# Patient Record
Sex: Female | Born: 1935 | Marital: Married | Smoking: Never smoker
Health system: Southern US, Community
[De-identification: ages and names within clinical notes are randomized; demographics above are authoritative.]

## PROBLEM LIST (undated history)

## (undated) DIAGNOSIS — I639 Cerebral infarction, unspecified: Secondary | ICD-10-CM

## (undated) DIAGNOSIS — C801 Malignant (primary) neoplasm, unspecified: Secondary | ICD-10-CM

## (undated) DIAGNOSIS — I671 Cerebral aneurysm, nonruptured: Secondary | ICD-10-CM

## (undated) DIAGNOSIS — G629 Polyneuropathy, unspecified: Secondary | ICD-10-CM

## (undated) DIAGNOSIS — F32A Depression, unspecified: Secondary | ICD-10-CM

## (undated) DIAGNOSIS — I4891 Unspecified atrial fibrillation: Secondary | ICD-10-CM

## (undated) DIAGNOSIS — F329 Major depressive disorder, single episode, unspecified: Secondary | ICD-10-CM

## (undated) DIAGNOSIS — F419 Anxiety disorder, unspecified: Secondary | ICD-10-CM

## (undated) DIAGNOSIS — E785 Hyperlipidemia, unspecified: Secondary | ICD-10-CM

## (undated) DIAGNOSIS — M5030 Other cervical disc degeneration, unspecified cervical region: Secondary | ICD-10-CM

## (undated) HISTORY — PX: COLON RESECTION: SHX5231

---

## 2011-11-19 ENCOUNTER — Ambulatory Visit: Payer: Self-pay | Admitting: Hematology and Oncology

## 2011-12-07 ENCOUNTER — Ambulatory Visit: Payer: Self-pay | Admitting: Hematology and Oncology

## 2012-09-24 ENCOUNTER — Emergency Department: Payer: Self-pay | Admitting: Emergency Medicine

## 2012-09-24 LAB — URINALYSIS, COMPLETE
Bilirubin,UR: NEGATIVE
RBC,UR: 32 /HPF (ref 0–5)
Specific Gravity: 1.013 (ref 1.003–1.030)
WBC UR: 337 /HPF (ref 0–5)

## 2012-09-24 LAB — COMPREHENSIVE METABOLIC PANEL
Albumin: 3.2 g/dL — ABNORMAL LOW (ref 3.4–5.0)
Alkaline Phosphatase: 74 U/L (ref 50–136)
BUN: 27 mg/dL — ABNORMAL HIGH (ref 7–18)
Bilirubin,Total: 0.9 mg/dL (ref 0.2–1.0)
Creatinine: 1.44 mg/dL — ABNORMAL HIGH (ref 0.60–1.30)
EGFR (African American): 37 — ABNORMAL LOW
EGFR (Non-African Amer.): 32 — ABNORMAL LOW
Glucose: 138 mg/dL — ABNORMAL HIGH (ref 65–99)
Potassium: 4 mmol/L (ref 3.5–5.1)
SGOT(AST): 18 U/L (ref 15–37)
SGPT (ALT): 14 U/L (ref 12–78)
Total Protein: 6.6 g/dL (ref 6.4–8.2)

## 2012-09-24 LAB — CBC WITH DIFFERENTIAL/PLATELET
Eosinophil %: 0.1 %
HCT: 37.1 % (ref 35.0–47.0)
HGB: 12.4 g/dL (ref 12.0–16.0)
Lymphocyte #: 0.4 10*3/uL — ABNORMAL LOW (ref 1.0–3.6)
Lymphocyte %: 3.3 %
MCHC: 33.4 g/dL (ref 32.0–36.0)
MCV: 99 fL (ref 80–100)
Monocyte %: 6.7 %
Neutrophil #: 9.9 10*3/uL — ABNORMAL HIGH (ref 1.4–6.5)
Neutrophil %: 89.5 %
RDW: 13.4 % (ref 11.5–14.5)

## 2013-05-27 ENCOUNTER — Observation Stay: Payer: Self-pay | Admitting: Family Medicine

## 2013-05-27 LAB — COMPREHENSIVE METABOLIC PANEL
Albumin: 3 g/dL — ABNORMAL LOW (ref 3.4–5.0)
Alkaline Phosphatase: 61 U/L
Anion Gap: 3 — ABNORMAL LOW (ref 7–16)
BILIRUBIN TOTAL: 0.4 mg/dL (ref 0.2–1.0)
BUN: 21 mg/dL — ABNORMAL HIGH (ref 7–18)
CALCIUM: 8.7 mg/dL (ref 8.5–10.1)
CREATININE: 1.45 mg/dL — AB (ref 0.60–1.30)
Chloride: 98 mmol/L (ref 98–107)
Co2: 36 mmol/L — ABNORMAL HIGH (ref 21–32)
EGFR (African American): 36 — ABNORMAL LOW
GFR CALC NON AF AMER: 31 — AB
Glucose: 92 mg/dL (ref 65–99)
OSMOLALITY: 276 (ref 275–301)
Potassium: 4 mmol/L (ref 3.5–5.1)
SGOT(AST): 21 U/L (ref 15–37)
SGPT (ALT): 11 U/L — ABNORMAL LOW (ref 12–78)
Sodium: 137 mmol/L (ref 136–145)
Total Protein: 6.4 g/dL (ref 6.4–8.2)

## 2013-05-27 LAB — PROTIME-INR
INR: 1.9
Prothrombin Time: 21.5 secs — ABNORMAL HIGH (ref 11.5–14.7)

## 2013-05-27 LAB — PRO B NATRIURETIC PEPTIDE: B-Type Natriuretic Peptide: 2482 pg/mL — ABNORMAL HIGH (ref 0–450)

## 2013-05-27 LAB — CBC WITH DIFFERENTIAL/PLATELET
Basophil #: 0 10*3/uL (ref 0.0–0.1)
Basophil %: 0.8 %
Eosinophil #: 0.1 10*3/uL (ref 0.0–0.7)
Eosinophil %: 3.1 %
HCT: 37.2 % (ref 35.0–47.0)
HGB: 12.1 g/dL (ref 12.0–16.0)
Lymphocyte #: 1.2 10*3/uL (ref 1.0–3.6)
Lymphocyte %: 25 %
MCH: 32.4 pg (ref 26.0–34.0)
MCHC: 32.6 g/dL (ref 32.0–36.0)
MCV: 100 fL (ref 80–100)
MONO ABS: 0.3 x10 3/mm (ref 0.2–0.9)
Monocyte %: 7 %
NEUTROS ABS: 3 10*3/uL (ref 1.4–6.5)
NEUTROS PCT: 64.1 %
Platelet: 147 10*3/uL — ABNORMAL LOW (ref 150–440)
RBC: 3.74 10*6/uL — ABNORMAL LOW (ref 3.80–5.20)
RDW: 13.8 % (ref 11.5–14.5)
WBC: 4.7 10*3/uL (ref 3.6–11.0)

## 2013-05-27 LAB — URINALYSIS, COMPLETE
BILIRUBIN, UR: NEGATIVE
Glucose,UR: NEGATIVE mg/dL (ref 0–75)
KETONE: NEGATIVE
Nitrite: NEGATIVE
PROTEIN: NEGATIVE
Ph: 7 (ref 4.5–8.0)
RBC,UR: 2 /HPF (ref 0–5)
Specific Gravity: 1.006 (ref 1.003–1.030)
Transitional Epi: <1
WBC UR: 158 /HPF (ref 0–5)

## 2013-05-27 LAB — CK TOTAL AND CKMB (NOT AT ARMC)
CK, TOTAL: 31 U/L
CK, Total: 34 U/L
CK, Total: 29 U/L
CK-MB: 1 ng/mL (ref 0.5–3.6)
CK-MB: 0.9 ng/mL (ref 0.5–3.6)
CK-MB: 1 ng/mL (ref 0.5–3.6)

## 2013-05-27 LAB — TROPONIN I
Troponin-I: <0.02 ng/mL
Troponin-I: <0.02 ng/mL
Troponin-I: <0.02 ng/mL

## 2013-05-28 DIAGNOSIS — R079 Chest pain, unspecified: Secondary | ICD-10-CM

## 2013-05-28 DIAGNOSIS — I10 Essential (primary) hypertension: Secondary | ICD-10-CM

## 2013-05-28 LAB — PROTIME-INR
INR: 2.2
Prothrombin Time: 24.1 secs — ABNORMAL HIGH (ref 11.5–14.7)

## 2013-05-28 LAB — BASIC METABOLIC PANEL
ANION GAP: 1 — AB (ref 7–16)
BUN: 19 mg/dL — ABNORMAL HIGH (ref 7–18)
CALCIUM: 8.9 mg/dL (ref 8.5–10.1)
CO2: 36 mmol/L — AB (ref 21–32)
Chloride: 103 mmol/L (ref 98–107)
Creatinine: 1.32 mg/dL — ABNORMAL HIGH (ref 0.60–1.30)
EGFR (African American): 41 — ABNORMAL LOW
EGFR (Non-African Amer.): 35 — ABNORMAL LOW
Glucose: 90 mg/dL (ref 65–99)
Osmolality: 281 (ref 275–301)
Potassium: 4.2 mmol/L (ref 3.5–5.1)
Sodium: 140 mmol/L (ref 136–145)

## 2013-05-28 LAB — CBC WITH DIFFERENTIAL/PLATELET
BASOS ABS: 0 10*3/uL (ref 0.0–0.1)
Basophil %: 1.1 %
EOS ABS: 0.2 10*3/uL (ref 0.0–0.7)
EOS PCT: 4 %
HCT: 37.4 % (ref 35.0–47.0)
HGB: 12.4 g/dL (ref 12.0–16.0)
LYMPHS ABS: 1 10*3/uL (ref 1.0–3.6)
LYMPHS PCT: 24.7 %
MCH: 33 pg (ref 26.0–34.0)
MCHC: 33.1 g/dL (ref 32.0–36.0)
MCV: 100 fL (ref 80–100)
MONO ABS: 0.4 x10 3/mm (ref 0.2–0.9)
Monocyte %: 9.2 %
Neutrophil #: 2.4 10*3/uL (ref 1.4–6.5)
Neutrophil %: 61 %
Platelet: 163 10*3/uL (ref 150–440)
RBC: 3.75 10*6/uL — ABNORMAL LOW (ref 3.80–5.20)
RDW: 13.6 % (ref 11.5–14.5)
WBC: 3.9 10*3/uL (ref 3.6–11.0)

## 2013-05-28 LAB — LIPID PANEL
Cholesterol: 201 mg/dL — ABNORMAL HIGH (ref 0–200)
HDL: 43 mg/dL (ref 40–60)
LDL CHOLESTEROL, CALC: 137 mg/dL — AB (ref 0–100)
TRIGLYCERIDES: 106 mg/dL (ref 0–200)
VLDL Cholesterol, Calc: 21 mg/dL (ref 5–40)

## 2013-05-28 LAB — TSH: Thyroid Stimulating Horm: 2.51 u[IU]/mL

## 2013-06-02 LAB — URINE CULTURE

## 2013-07-11 ENCOUNTER — Emergency Department: Payer: Self-pay | Admitting: Emergency Medicine

## 2013-07-12 LAB — CBC WITH DIFFERENTIAL/PLATELET
BASOS ABS: 0 10*3/uL (ref 0.0–0.1)
BASOS PCT: 0.2 %
EOS ABS: 0.1 10*3/uL (ref 0.0–0.7)
Eosinophil %: 0.5 %
HCT: 41.9 % (ref 35.0–47.0)
HGB: 13.4 g/dL (ref 12.0–16.0)
LYMPHS ABS: 0.3 10*3/uL — AB (ref 1.0–3.6)
LYMPHS PCT: 2.8 %
MCH: 32.1 pg (ref 26.0–34.0)
MCHC: 32.1 g/dL (ref 32.0–36.0)
MCV: 100 fL (ref 80–100)
Monocyte #: 0.1 x10 3/mm — ABNORMAL LOW (ref 0.2–0.9)
Monocyte %: 0.8 %
Neutrophil #: 10.5 10*3/uL — ABNORMAL HIGH (ref 1.4–6.5)
Neutrophil %: 95.7 %
Platelet: 211 10*3/uL (ref 150–440)
RBC: 4.19 10*6/uL (ref 3.80–5.20)
RDW: 14.1 % (ref 11.5–14.5)
WBC: 11 10*3/uL (ref 3.6–11.0)

## 2013-07-12 LAB — COMPREHENSIVE METABOLIC PANEL
ALBUMIN: 3.6 g/dL (ref 3.4–5.0)
ALK PHOS: 108 U/L
AST: 26 U/L (ref 15–37)
Anion Gap: 8 (ref 7–16)
BILIRUBIN TOTAL: 0.9 mg/dL (ref 0.2–1.0)
BUN: 19 mg/dL — AB (ref 7–18)
CREATININE: 1.48 mg/dL — AB (ref 0.60–1.30)
Calcium, Total: 9 mg/dL (ref 8.5–10.1)
Chloride: 99 mmol/L (ref 98–107)
Co2: 29 mmol/L (ref 21–32)
GFR CALC AF AMER: 36 — AB
GFR CALC NON AF AMER: 31 — AB
Glucose: 137 mg/dL — ABNORMAL HIGH (ref 65–99)
OSMOLALITY: 276 (ref 275–301)
Potassium: 4 mmol/L (ref 3.5–5.1)
SGPT (ALT): 17 U/L (ref 12–78)
Sodium: 136 mmol/L (ref 136–145)
Total Protein: 7.6 g/dL (ref 6.4–8.2)

## 2013-07-12 LAB — PROTIME-INR
INR: 2.3
Prothrombin Time: 24.7 secs — ABNORMAL HIGH (ref 11.5–14.7)

## 2014-04-29 NOTE — H&P (Signed)
PATIENT NAME:  Martha Mayer, Martha Mayer MR#:  [id number] DATE OF BIRTH:  Jun 24, 1919  DATE OF ADMISSION:  05/27/2013  PRIMARY CARE PHYSICIAN: Delon Sacramento, MD  REFERRING PHYSICIAN: Dr. Owens Shark  CHIEF COMPLAINT: Chest pain.  HISTORY OF PRESENT ILLNESS: The patient is a 81 year old pleasant Caucasian female with past medical history of chronic atrial fibrillation on Coumadin, hypertension, history of stroke, and chronic lower extremity edema who is presenting to the ER with the chief complaint of chest pain. The patient is reporting that yesterday evening at around 9:00 the patient was having midsternal chest pain associated with shortness of breath and nausea. Denies any dizziness or loss of consciousness. Eventually she has called EMS and by the time she came to the ER the chest pain is completely resolved. During my examination, the patient is chest pain-free. The patient is having right eye patch and reporting that she has clots in her right eye blood vessels and they could not evacuate them. The patient has chronic atrial fibrillation and takes Coumadin 6 mg once daily. Denies any heart attacks in the past, but she has chronic atrial fibrillation. She has chronic lower extremity edema and takes a fluid pill, Lasix, regarding that, but denies any history of congestive heart failure. Denies any chest pain or shortness of breath during my examination. The patient does not think she was seen by any cardiologist in the past and she never had any stress test done.   PAST MEDICAL HISTORY: Hypertension, stroke, chronic lower extremity edema, chronic atrial fibrillation and on Coumadin, colon cancer, arthritis, bladder cancer.   PAST SURGICAL HISTORY: Partial colectomy, cystoscopy.  ALLERGIES: METHADONE AND STATIN.   HOME MEDICATIONS: Warfarin 6 mg once daily, vitamin B12 1000 mcg p.o. once daily, Tylenol 325 mg 2 tablets p.o. 3 times a day, TUMS 1 tablet p.o. 3 times a day, Sanctura extended release 60 mg 1  capsule p.o. once daily, allopurinol 50 mg p.o. once daily, MiraLax 17 grams p.o. once daily, metoprolol tartrate 25 mg p.o. b.i.d., Lexapro 10 mg once daily, Lamictal 25 mg 3 tablets 2 times a day, Gabapentin 300 mg 1 capsule p.o. once a day, furosemide 40 mg p.o. once daily, fish oil 1 tablet p.o. 2 times a day, aspirin 81 mg once daily, alprazolam 0.25 mg 1/2 tablet p.o. once daily.   FAMILY HISTORY: Hypertension runs in her family.   PSYCHOSOCIAL HISTORY: Lives at home with her son. No smoking, alcohol or illicit drug usage.  REVIEW OF SYSTEMS: CONSTITUTIONAL: Denies any fever, fatigue, weakness.  EYES: Denies blurry vision, but complaining of right eye pain, recent history of clots in her right eye which were not evacuated, currently wearing eyepatch with decreased vision.  ENT: Denies epistaxis, discharge, hearing loss or tinnitus.  RESPIRATORY: Denies cough, COPD. CARDIOVASCULAR: Chest pain is resolved. Denies any palpitations, syncope.  GASTROINTESTINAL: Denies nausea, vomiting, diarrhea, abdominal pain, hematemesis.  GENITOURINARY: No dysuria or hematuria. GYN AND BREASTS: Denies breast mass or vaginal discharge.  ENDOCRINE: Denies polyuria, nocturia, thyroid problems.  HEMATOLOGIC AND LYMPHATIC: No anemia, easy bruising, bleeding. INTEGUMENTARY: No acne, rash, lesions.  MUSCULOSKELETAL: No joint pain in the neck and back. Denies gout.  NEUROLOGIC: Denies vertigo, ataxia. Denies any weakness.  PSYCHIATRIC: No ADD, OCD.   PHYSICAL EXAMINATION: VITAL SIGNS: Temperature 98.7, pulse 67, respirations 18, blood pressure 113/76, pulse ox 95%.  GENERAL APPEARANCE: Not in acute distress. Moderately built and nourished.  HEENT: Normocephalic, atraumatic. Left pupil is equally reacting to light and accommodation. Right eye is partially  shut and wearing right eye patch because of recent history of clots in her ophthalmic arteries, as reported by the patient. Nares are patent. No congestion.  Moist mucous membranes.  NECK: Supple. No JVD. No thyromegaly. Range of motion is intact.  LUNGS: Clear to auscultation bilaterally. No accessory muscle usage. No anterior chest wall tenderness on palpation.  CARDIOVASCULAR: Irregularly irregular. Positive murmur. No gallop.  ABDOMEN: Soft. Bowel sounds are positive in all 4 quadrants. Nontender, nondistended. No hepatosplenomegaly. No masses felt.  NEUROLOGIC: Awake, alert, and oriented x3. Motor and sensory grossly intact. Reflexes are 2+.  EXTREMITIES: 2+ pitting edema is present. No cyanosis. No clubbing.  SKIN: Warm to touch. Decreased turgor from age. No cyanosis.  PSYCH: Normal mood and affect.  DIAGNOSTIC DATA: PT 21.5. INR 1.9. Urinalysis: Yellow in color, hazy in appearance, nitrite negative, leukocyte esterase 3+, WBC clumps are present.  WBC 4.7, hemoglobin 12.1, hematocrit 37.2, platelets 147,000. CK total 34, CPK-MB 1. Troponin less than 0.02. LFTs are normal, except albumin at 3 and ALT at 11. Chem-8: Glucose 92. BNP is 2482. BUN 21, creatinine 1.45, sodium 137. Potassium normal. Chloride normal. Anion gap is 3. Serum osmolality and calcium are normal. GFR 31.  Portable chest x-ray has revealed COPD, stable cardiomegaly without pulmonary edema or acute cardiac abnormalities.   A 12-lead EKG has revealed atrial fibrillation at 66 beats per minute. No acute ST-T wave changes.   ASSESSMENT AND PLAN: A 81 year old Caucasian female presenting to the ER with the chief complaint of chest pain and shortness of breath, approximately lasted for 1 hour, which was resolved by the time she arrived to the ER. Will be admitted with the following assessment and plan: 1.  Chest pain. Rule out acute coronary syndrome. The patient will be admitted to telemetry bed. ACS protocol with oxygen, nitroglycerin, aspirin, beta blocker and statin. Will obtain 2-D echocardiogram. Cardiology consult is placed to Dr. Humphrey Rolls as the patient is reporting that she was  not seen by any cardiologist in the past.  2.  Chronic atrial fibrillation. INR is subtherapeutic at 1.9. Continue Coumadin and follow PT/INR.  3.  Chronic lower extremity edema. Continue Lasix and potassium supplements as needed basis.  4.  Hypertension. Continue metoprolol and up titrate medications as needed basis.  5.  Chronic history of cerebrovascular accident. Continue aspirin and statin.  6.  Acute cystitis. Will obtain urine culture and provide her IV Rocephin.  7.  Recent history of right eye with blood clots, status post eye patch. The patient is recommended to follow up with ophthalmology as an outpatient.  8.  We will provide gastrointestinal and deep vein thrombosis prophylaxis.   She is FULL code. Son is the medical power of attorney.  TIME SPENT: 50 minutes.   ____________________________ Nicholes Mango, MD ag:sb D: 05/27/2013 08:02:20 ET T: 05/27/2013 09:21:11 ET JOB#: [id number]  cc: Nicholes Mango, MD, <Dictator> [facility] [place], [place] Nicholes Mango MD ELECTRONICALLY SIGNED 06/09/2013 7:54

## 2014-04-29 NOTE — Consult Note (Signed)
PATIENT NAME:  Martha Mayer, Martha Mayer MR#:  [id number] DATE OF BIRTH:  02-25-1919  DATE OF CONSULTATION:  05/27/2013  REFERRING PHYSICIAN:   CONSULTING PHYSICIAN:  Dionisio David, MD  INDICATION FOR CONSULTATION: Chest pain.   HISTORY OF PRESENT ILLNESS: This is a 81 year old white female with a past medical history of chronic atrial fibrillation on Coumadin, history of CVA and hypertension, who presented to the Emergency Room with chest pain. The chest pain started yesterday around 9:00 a.m. and was brought by EMS to the hospital. In the hospital, she was found to be in atrial fibrillation and she had swelling of the legs. It appeared that she was in CHF, thus I was asked to evaluate the patient.   PAST MEDICAL HISTORY: History of hypertension, stroke, lower extremity edema, history of atrial fibrillation on Coumadin, colon cancer, arthritis and bladder cancer.   ALLERGIES: METHADONE AND STATIN.   MEDICATIONS: Coumadin 6 mg, MiraLax, metoprolol, Lexapro and7 Lasix.   FAMILY HISTORY: Positive for hypertension.  PSYCHOSOCIAL HISTORY: Unremarkable.   PHYSICAL EXAMINATION:  GENERAL: She is alert and oriented x 3, in no acute distress. VITAL SIGNS: Blood pressure is 113/76, respirations 18, pulse 67, temperature 98.7 and saturation is 90%.  NECK: Positive JVD.  LUNGS: There are few crackles at the bases.  HEART: Irregularly irregular. Normal S1, S2. No audible murmur.  ABDOMEN: Soft, nontender, positive bowel sounds.  EXTREMITIES: 1+ pedal edema.   EKG shows atrial fibrillation, 66 beats per minute, nonspecific ST-T changes. Creatinine is 1.45. BNP is 2482. CPK is 34. MB fraction 1. Troponin is less than 0.02.   ASSESSMENT AND PLAN: The patient has atypical chest pain with findings suggestive of congestive heart failure. I agree with getting an echocardiogram. I also agree with putting the patient on aspirin and Lasix 40 mg once a day. Advised changing it to intravenous and will look at  echocardiogram to make further recommendations. The patient appears to be no longer having any chest pain. We have to see whether the patient has diastolic or systolic heart failure based on echocardiogram. Thus, we will make further recommendation after looking at echocardiogram. Right now with medical therapy, she has stabilized and it appears that her troponins are negative thus myocardial infarction has been pretty much ruled out.   Will follow closely with you. Thank you very much for the referral.  ____________________________ Dionisio David, MD sak:aw D: 05/27/2013 10:41:25 ET T: 05/27/2013 10:50:32 ET JOB#: [id number]  cc: Dionisio David, MD, <Dictator> Dionisio David MD ELECTRONICALLY SIGNED 06/01/2013 13:59

## 2014-04-29 NOTE — Discharge Summary (Signed)
PATIENT NAME:  Martha Mayer, Martha Mayer MR#:  [id number] DATE OF BIRTH:  1919/05/02  DATE OF ADMISSION:  05/27/2013 DATE OF DISCHARGE:  05/28/2013  REASON FOR ADMISSION: Initially chest pain.  DISCHARGE DIAGNOSES:  1.  Chest pain, atypical. 2.  Diastolic heart failure, compensated. 3.  Chronic atrial fibrillation. 4.  Chronic anticoagulation. 5.  Chronic lower extremity edema. 6.  Hypertension. 7.  History of previous cerebrovascular accident. 8.  Urinary tract infection was ruled out, as cultures were negative. 9.  History of recurrent urinary tract infections, on prophylaxis. 10.  History of right eye blood clots, status post eye patch. 11.  Chronic kidney disease. 12.  Hypercholesterolemia with LDL of 137. Her coronary artery disease risk score over 10 years is only 12%, for which no statin is going to be prescribed since she is already 41 based on possibility of creating more side-effects.   DISPOSITION: Home.  MEDICATIONS AT DISCHARGE: 1.  Aspirin 81 mg daily. 2.  Furosemide 40 mg daily. 3.  TUMS 500 mg 3 times daily. 4.  Vitamin B12, 1000 mcg once a day. 5.  Tylenol 325 mg 2 tablets twice daily as needed for pain. 6.  Simethicone 80 mg tablet chewable 3 times a day. 7.  Enulose as needed for constipation. 8.  Myrbetriq 25 mg extended-release once a day. 9.  Sanctura XR 60 mg once a day in the morning. 10.  Milk of Magnesia as needed for constipation. 11.  MiraLax as needed for constipation. 12.  Warfarin 6 mg once a day. 13.  Lexapro 10 mg once daily.  14.  Metamucil 1 scoop once a day. 15.  Alprazolam 0.25 mg take 1/2 tablet once a day at bedtime. 16.  Keflex 250 mg at bedtime for urinary tract infection prevention. 17.  Gabapentin 300 mg once a day. 18.  Xalatan to affected eye at bedtime. 19.  Ranitidine 150 mg at bedtime. 20.  Fish oil 1 tab 2 times daily. 21.  Lamictal 25 mg take 2 tablets twice daily. 22.  Metoprolol 25 mg twice daily.  DIET: Low-sodium, regular  consistency.  ACTIVITY: As tolerated.  FOLLOWUP: With Dr. Neoma Laming in the next 1 to 2 weeks. With PA [facility] in the next 2 to 4 weeks.   HOSPITAL COURSE: This is a very nice 81 year old female who was admitted with a history of chest pain. The patient was admitted on 05/27/2013 in the Emergency Department. She came up with chief complaint of pain starting at 9 p.m. the night of evaluation, midsternal, associated with shortness of breath and nausea. The patient also had mild dizziness but no other problems. She was found to have recently right eye blood clots, for which she wears a patch, and she has been taking Coumadin. The patient has lower extremity edema, which is chronic, and did not know that she had congestive heart failure or not on admission. Whenever the patient was evaluated, cardiac enzymes were negative. EKG did not show any significant changes of the ST with any depression or elevation. It just showed atrial fibrillation with a heart rate of 66 per minute. The patient was admitted for evaluation of chest pain. We consulted cardiology. Cardiology thought that this was more related to her CHF and decided to diurese her with IV Lasix. The patient had good diuresis, and her pain was resolved pretty much since she arrived to the emergency department. The patient states that this has never happened before, but she feels confident that she is not  having a heart attack. We recommended followup with Dr. Humphrey Rolls. At this moment, she has an echocardiogram that shows significant changes with no valvulopathy, significant hypertensive cardiomyopathy with ejection fraction of 60% to 65%, severely dilated left atrium, mildly dilated right atrium, mild aortic valve stenosis, severely increased left ventricular posterior wall thickness. All of this puts her in a category of diastolic dysfunction. The patient had a urinalysis that showed a possibility of UTI with white blood cells of 158, bacteria  +3, leukocyte esterase +3, but I believe this is just in general chronic pyuria, as her urine culture was negative. We stopped the antibiotics and sent her home back on her regular dose of Keflex.   The patient did not require any significant oxygen. Her chest x-ray did not show any pulmonary edema, just changes that could be recurrent for COPD. On my visual examination, I actually see some fluid cephalization, but not significant to call it a CHF exacerbation. I think this is more a compensated problem.  The patient is discharged in good condition.   As for other problems: 1.  Hypertension: The patient is just to continue her home medications with metoprolol and also her Lasix treatment.  2.  As far as her recurrent UTIs, continue Keflex. 3.  As far as her chronic atrial fibrillation, her INR was supratherapeutic at 1.9. Continue Coumadin at 6 mg for now.  4.  She had a CVA in the past, for which she needs to continue aspirin. Statin was started, but stopped as the patient has more risk of having side-effects from these medications at her age, and her cardiovascular risk is only 12% in the next 10 years. Continue to monitor and treatment of blood pressure. Follow up with Dr. Humphrey Rolls for further evaluation as necessary.  The patient did well during this hospitalization.   I spent about 40 minutes discharging this patient. Case discussed with family.    ____________________________ Milford Sink, MD rsg:jcm D: 05/28/2013 09:36:52 ET T: 05/28/2013 19:58:47 ET JOB#: [id number]  cc: Faulk Sink, MD, <Dictator> Dionisio David, MD [place], [place] Shalin Vonbargen America Brown MD ELECTRONICALLY SIGNED 06/11/2013 10:50

## 2014-12-02 ENCOUNTER — Encounter: Payer: Self-pay | Admitting: Medical Oncology

## 2014-12-02 ENCOUNTER — Emergency Department: Payer: Medicare Other

## 2014-12-02 ENCOUNTER — Emergency Department
Admission: EM | Admit: 2014-12-02 | Discharge: 2014-12-02 | Disposition: A | Discharge status: Home / Self Care | Payer: Medicare Other | Attending: Emergency Medicine | Admitting: Emergency Medicine

## 2014-12-02 DIAGNOSIS — Z79899 Other long term (current) drug therapy: Secondary | ICD-10-CM | POA: Insufficient documentation

## 2014-12-02 DIAGNOSIS — M79641 Pain in right hand: Secondary | ICD-10-CM

## 2014-12-02 DIAGNOSIS — Z792 Long term (current) use of antibiotics: Secondary | ICD-10-CM | POA: Diagnosis not present

## 2014-12-02 DIAGNOSIS — Z7901 Long term (current) use of anticoagulants: Secondary | ICD-10-CM | POA: Insufficient documentation

## 2014-12-02 DIAGNOSIS — M19041 Primary osteoarthritis, right hand: Secondary | ICD-10-CM | POA: Diagnosis not present

## 2014-12-02 HISTORY — DX: Hyperlipidemia, unspecified: E78.5

## 2014-12-02 HISTORY — DX: Depression, unspecified: F32.A

## 2014-12-02 HISTORY — DX: Other cervical disc degeneration, unspecified cervical region: M50.30

## 2014-12-02 HISTORY — DX: Major depressive disorder, single episode, unspecified: F32.9

## 2014-12-02 HISTORY — DX: Unspecified atrial fibrillation: I48.91

## 2014-12-02 HISTORY — DX: Cerebral infarction, unspecified: I63.9

## 2014-12-02 HISTORY — DX: Polyneuropathy, unspecified: G62.9

## 2014-12-02 HISTORY — DX: Malignant (primary) neoplasm, unspecified: C80.1

## 2014-12-02 HISTORY — DX: Anxiety disorder, unspecified: F41.9

## 2014-12-02 MED ORDER — ACETAMINOPHEN-CODEINE #3 300-30 MG PO TABS: 1.0000 | ORAL_TABLET | Freq: Three times a day (TID) | ORAL | Status: DC | PRN

## 2014-12-02 MED ORDER — ACETAMINOPHEN-CODEINE #3 300-30 MG PO TABS
1.0000 | ORAL_TABLET | Freq: Once | ORAL | Status: AC
  Administered 2014-12-02: 1 via ORAL
  Filled 2014-12-02: qty 1

## 2014-12-02 NOTE — ED Provider Notes (Signed)
[facility] Emergency Department Provider Note ____________________________________________  Time seen: 0845  I have reviewed the triage vital signs and the nursing notes.  HISTORY  Chief Complaint  Hand Pain  HPI Martha Mayer is a 81 y.o. female reports to the ED by her adult daughter from [facility], for evaluation of right hand pain. The patient reports intermittent hand pain over the last several months without known injury or trauma. She describes intermittent pain, and numbness to the right hand. She has a history of osteoarthritis which is worse in the right hand. She denies any relief with intermittent Tylenol dosing at the nursing home.She denies any current pain at intake.  Past Medical History  Diagnosis Date  . Atrial fibrillation, unspecified   . Hyperlipemia   . Neuropathy ([place])   . Depression   . DDD (degenerative disc disease), cervical   . Anxiety   . Cancer ([place])   . Stroke [facility])     There are no active problems to display for this patient.   Past Surgical History  Procedure Laterality Date  . Colon resection      Current Outpatient Rx  Name  Route  Sig  Dispense  Refill  . ALPRAZolam (XANAX) 0.25 MG tablet   Oral   Take 0.25 mg by mouth at bedtime.         . cephALEXin (KEFLEX) 250 MG capsule   Oral   Take by mouth at bedtime.         . docusate sodium (COLACE) 100 MG capsule   Oral   Take 100 mg by mouth 2 (two) times daily.         Marland Kitchen escitalopram (LEXAPRO) 20 MG tablet   Oral   Take 20 mg by mouth daily.         . furosemide (LASIX) 40 MG tablet   Oral   Take 40 mg by mouth.         . gabapentin (NEURONTIN) 300 MG capsule   Oral   Take 300 mg by mouth at bedtime.         . lamoTRIgine (LAMICTAL) 100 MG tablet   Oral   Take 100 mg by mouth 2 (two) times daily.         Marland Kitchen latanoprost (XALATAN) 0.005 % ophthalmic solution      1 drop at bedtime.         . metoprolol tartrate  (LOPRESSOR) 25 MG tablet   Oral   Take 25 mg by mouth 2 (two) times daily.         . mirabegron ER (MYRBETRIQ) 50 MG TB24 tablet   Oral   Take 50 mg by mouth daily.         . Omega-3 Fatty Acids (FISH OIL) 1000 MG CAPS   Oral   Take 1,000 mg by mouth 2 (two) times daily.         Marland Kitchen oxybutynin (DITROPAN-XL) 5 MG 24 hr tablet   Oral   Take 5 mg by mouth at bedtime.         . prochlorperazine (COMPAZINE) 5 MG tablet   Oral   Take 5 mg by mouth every 6 (six) hours as needed for nausea or vomiting.         . psyllium (METAMUCIL) 58.6 % powder   Oral   Take 1 packet by mouth 3 (three) times daily.         Marland Kitchen warfarin (COUMADIN) 7.5 MG tablet  Oral   Take 7.5 mg by mouth daily.         Marland Kitchen acetaminophen-codeine (TYLENOL #3) 300-30 MG tablet   Oral   Take 1 tablet by mouth every 8 (eight) hours as needed for moderate pain.   15 tablet   0    Allergies Nystatin  No family history on file.  Social History Social History  Substance Use Topics  . Smoking status: Never Smoker   . Smokeless tobacco: None  . Alcohol Use: No   Review of Systems  Constitutional: Negative for fever. Eyes: Negative for visual changes. ENT: Negative for sore throat. Cardiovascular: Negative for chest pain. Respiratory: Negative for shortness of breath. Gastrointestinal: Negative for abdominal pain, vomiting and diarrhea. Genitourinary: Negative for dysuria. Musculoskeletal: Negative for back pain. Right hand pain as above. Skin: Negative for rash. Neurological: Negative for headaches, focal weakness or numbness. ____________________________________________  PHYSICAL EXAM:  VITAL SIGNS: ED Triage Vitals  Enc Vitals Group     BP 12/02/14 0728 165/62 mmHg     Pulse Rate 12/02/14 0728 81     Resp 12/02/14 0728 20     Temp 12/02/14 0728 98.1 F (36.7 C)     Temp Source 12/02/14 0728 Oral     SpO2 12/02/14 0728 96 %     Weight 12/02/14 0728 207 lb (93.895 kg)     Height  12/02/14 0728 5\' 3"  (1.6 m)     Head Cir --      Peak Flow --      Pain Score 12/02/14 0729 0     Pain Loc --      Pain Edu? --      Excl. in [place]? --    Constitutional: Alert and oriented. Well appearing and in no distress. Head: Normocephalic and atraumatic.      Eyes: Conjunctivae are normal. PERRL. Normal extraocular movements      Ears: Canals clear. TMs intact bilaterally.   Nose: No congestion/rhinorrhea.   Mouth/Throat: Mucous membranes are moist.   Neck: Supple. No thyromegaly. Hematological/Lymphatic/Immunological: No cervical lymphadenopathy. Cardiovascular: Normal rate, regular rhythm. Normal distal pulses. Respiratory: Normal respiratory effort. No wheezes/rales/rhonchi. Gastrointestinal: Soft and nontender. No distention. Musculoskeletal: Right hand without obvious bony changes consistent with underlying osteoarthritis. Hand is without any known noticeable swelling, ecchymosis, bruise, or abrasion. Patient was normal composite fist on exam. Nontender with normal range of motion in all extremities.  Neurologic:  Cranial nerves II through XII grossly intact. Normal intrinsic and opposition testing. Mild interosseous muscle wasting noted. Normal gait without ataxia. Normal speech and language. No gross focal neurologic deficits are appreciated. Skin:  Skin is warm, dry and intact. No rash noted. Psychiatric: Mood and affect are normal. Patient exhibits appropriate insight and judgment. ____________________________________________   RADIOLOGY Left Hand IMPRESSION: 1. Moderate to severe polyarticular osteoarthritis in the right hand as described. 2. Diffuse osteopenia.  I, Algernon Mundie, Dannielle Karvonen, personally viewed and evaluated these images (plain radiographs) as part of my medical decision making.  ____________________________________________  PROCEDURES  Tylenol #3  ____________________________________________  INITIAL IMPRESSION / ASSESSMENT AND PLAN / ED  COURSE  Patient with right hand pain and paresthesias likely due to underlying osteoarthritis. She will be discharged with prescription for Tylenol with Codeine to dose as needed. She will follow up with the house officer for ongoing pain management as needed. ____________________________________________  FINAL CLINICAL IMPRESSION(S) / ED DIAGNOSES  Final diagnoses:  Primary osteoarthritis of right hand  Hand pain, right  Dannielle Karvonen [place], PA-C 12/02/14 0932  Nance Pear, MD 12/02/14 1041

## 2014-12-02 NOTE — ED Notes (Signed)
Right hand pain for several months   Became worse over the past 2 days   Was unable to sleep last pm d/t pain  No injury

## 2014-12-02 NOTE — Discharge Instructions (Signed)
Osteoarthritis Osteoarthritis is a disease that causes soreness and inflammation of a joint. It occurs when the cartilage at the affected joint wears down. Cartilage acts as a cushion, covering the ends of bones where they meet to form a joint. Osteoarthritis is the most common form of arthritis. It often occurs in older people. The joints affected most often by this condition include those in the:  Ends of the fingers.  Thumbs.  Neck.  Lower back.  Knees.  Hips. CAUSES  Over time, the cartilage that covers the ends of bones begins to wear away. This causes bone to rub on bone, producing pain and stiffness in the affected joints.  RISK FACTORS Certain factors can increase your chances of having osteoarthritis, including:  Older age.  Excessive body weight.  Overuse of joints.  Previous joint injury. SIGNS AND SYMPTOMS   Pain, swelling, and stiffness in the joint.  Over time, the joint may lose its normal shape.  Small deposits of bone (osteophytes) may grow on the edges of the joint.  Bits of bone or cartilage can break off and float inside the joint space. This may cause more pain and damage. DIAGNOSIS  Your health care provider will do a physical exam and ask about your symptoms. Various tests may be ordered, such as:  X-rays of the affected joint.  Blood tests to rule out other types of arthritis. Additional tests may be used to diagnose your condition. TREATMENT  Goals of treatment are to control pain and improve joint function. Treatment plans may include:  A prescribed exercise program that allows for rest and joint relief.  A weight control plan.  Pain relief techniques, such as:  Properly applied heat and cold.  Electric pulses delivered to nerve endings under the skin (transcutaneous electrical nerve stimulation [TENS]).  Massage.  Certain nutritional supplements.  Medicines to control pain, such as:  Acetaminophen.  Nonsteroidal  anti-inflammatory drugs (NSAIDs), such as naproxen.  Narcotic or central-acting agents, such as tramadol.  Corticosteroids. These can be given orally or as an injection.  Surgery to reposition the bones and relieve pain (osteotomy) or to remove loose pieces of bone and cartilage. Joint replacement may be needed in advanced states of osteoarthritis. HOME CARE INSTRUCTIONS   Take medicines only as directed by your health care provider.  Maintain a healthy weight. Follow your health care provider's instructions for weight control. This may include dietary instructions.  Exercise as directed. Your health care provider can recommend specific types of exercise. These may include:  Strengthening exercises. These are done to strengthen the muscles that support joints affected by arthritis. They can be performed with weights or with exercise bands to add resistance.  Aerobic activities. These are exercises, such as brisk walking or low-impact aerobics, that get your heart pumping.  Range-of-motion activities. These keep your joints limber.  Balance and agility exercises. These help you maintain daily living skills.  Rest your affected joints as directed by your health care provider.  Keep all follow-up visits as directed by your health care provider. SEEK MEDICAL CARE IF:   Your skin turns red.  You develop a rash in addition to your joint pain.  You have worsening joint pain.  You have a fever along with joint or muscle aches. SEEK IMMEDIATE MEDICAL CARE IF:  You have a significant loss of weight or appetite.  You have night sweats. [place] of Arthritis and Musculoskeletal and Skin Diseases: www.niams.SouthExposed.es  National Institute on  Aging: http://kim-miller.com/  American College of Rheumatology: www.rheumatology.org   This information is not intended to replace advice given to you by your health care provider. Make sure you discuss any questions you  have with your health care provider.   Document Released: 12/23/2004 Document Revised: 01/13/2014 Document Reviewed: 08/30/2012 Elsevier Interactive Patient Education 2016 [place] the prescription pain medicine as needed for hand pain. Follow-up with you primary care provider for ongoing symptoms.

## 2014-12-02 NOTE — ED Notes (Signed)
PT reports that she has been having pain to rt hand since last night, pt reports that she has been having numbness to rt hand off and on for a few months but pain worsened last night.

## 2015-07-06 ENCOUNTER — Encounter: Payer: Self-pay | Admitting: Emergency Medicine

## 2015-07-06 ENCOUNTER — Emergency Department
Admission: EM | Admit: 2015-07-06 | Discharge: 2015-07-06 | Disposition: A | Discharge status: Home / Self Care | Payer: Medicare Other | Attending: Emergency Medicine | Admitting: Emergency Medicine

## 2015-07-06 ENCOUNTER — Emergency Department: Payer: Medicare Other

## 2015-07-06 DIAGNOSIS — Z8673 Personal history of transient ischemic attack (TIA), and cerebral infarction without residual deficits: Secondary | ICD-10-CM | POA: Diagnosis not present

## 2015-07-06 DIAGNOSIS — Z79899 Other long term (current) drug therapy: Secondary | ICD-10-CM | POA: Insufficient documentation

## 2015-07-06 DIAGNOSIS — W1800XA Striking against unspecified object with subsequent fall, initial encounter: Secondary | ICD-10-CM | POA: Insufficient documentation

## 2015-07-06 DIAGNOSIS — Z7901 Long term (current) use of anticoagulants: Secondary | ICD-10-CM | POA: Insufficient documentation

## 2015-07-06 DIAGNOSIS — Y929 Unspecified place or not applicable: Secondary | ICD-10-CM | POA: Insufficient documentation

## 2015-07-06 DIAGNOSIS — E785 Hyperlipidemia, unspecified: Secondary | ICD-10-CM | POA: Insufficient documentation

## 2015-07-06 DIAGNOSIS — I671 Cerebral aneurysm, nonruptured: Secondary | ICD-10-CM | POA: Insufficient documentation

## 2015-07-06 DIAGNOSIS — Y939 Activity, unspecified: Secondary | ICD-10-CM | POA: Diagnosis not present

## 2015-07-06 DIAGNOSIS — Z859 Personal history of malignant neoplasm, unspecified: Secondary | ICD-10-CM | POA: Insufficient documentation

## 2015-07-06 DIAGNOSIS — Y999 Unspecified external cause status: Secondary | ICD-10-CM | POA: Diagnosis not present

## 2015-07-06 DIAGNOSIS — I4891 Unspecified atrial fibrillation: Secondary | ICD-10-CM | POA: Insufficient documentation

## 2015-07-06 DIAGNOSIS — M5030 Other cervical disc degeneration, unspecified cervical region: Secondary | ICD-10-CM | POA: Insufficient documentation

## 2015-07-06 DIAGNOSIS — F329 Major depressive disorder, single episode, unspecified: Secondary | ICD-10-CM | POA: Insufficient documentation

## 2015-07-06 DIAGNOSIS — S0990XA Unspecified injury of head, initial encounter: Secondary | ICD-10-CM | POA: Diagnosis present

## 2015-07-06 LAB — BASIC METABOLIC PANEL
Anion gap: 5 (ref 5–15)
BUN: 24 mg/dL — AB (ref 6–20)
CHLORIDE: 103 mmol/L (ref 101–111)
CO2: 32 mmol/L (ref 22–32)
CREATININE: 1.24 mg/dL — AB (ref 0.44–1.00)
Calcium: 8.7 mg/dL — ABNORMAL LOW (ref 8.9–10.3)
GFR calc Af Amer: 42 mL/min — ABNORMAL LOW (ref >60)
GFR, EST NON AFRICAN AMERICAN: 36 mL/min — AB (ref >60)
GLUCOSE: 96 mg/dL (ref 65–99)
Potassium: 4.1 mmol/L (ref 3.5–5.1)
SODIUM: 140 mmol/L (ref 135–145)

## 2015-07-06 MED ORDER — GADOBENATE DIMEGLUMINE 529 MG/ML IV SOLN
10.0000 mL | Freq: Once | INTRAVENOUS | Status: AC | PRN
  Administered 2015-07-06: 9 mL via INTRAVENOUS

## 2015-07-06 MED ORDER — OXYCODONE-ACETAMINOPHEN 5-325 MG PO TABS
1.0000 | ORAL_TABLET | Freq: Once | ORAL | Status: AC
  Administered 2015-07-06: 1 via ORAL
  Filled 2015-07-06: qty 1

## 2015-07-06 NOTE — ED Provider Notes (Signed)
[facility] Emergency Department Provider Note  ____________________________________________  Time seen: 6:15 AM  I have reviewed the triage vital signs and the nursing notes.   HISTORY  Chief Complaint Fall     HPI Martha Mayer is a 82 y.o. female presents via EMS from [place] facility status post fall. Patient states "my knees gave out happens all the time". Patient admits to occipital trauma from the fall but denies any loss of consciousness. Patient denies any neck pain at this time. Patient states current pain score is 4 out of 10 patient denies any aggravating or alleviating factors for her pain.     Past Medical History  Diagnosis Date  . Atrial fibrillation, unspecified   . Hyperlipemia   . Neuropathy ([place])   . Depression   . DDD (degenerative disc disease), cervical   . Anxiety   . Cancer ([place])   . Stroke [facility])     There are no active problems to display for this patient.   Past Surgical History  Procedure Laterality Date  . Colon resection      Current Outpatient Rx  Name  Route  Sig  Dispense  Refill  . acetaminophen-codeine (TYLENOL #3) 300-30 MG tablet   Oral   Take 1 tablet by mouth every 8 (eight) hours as needed for moderate pain.   15 tablet   0   . ALPRAZolam (XANAX) 0.25 MG tablet   Oral   Take 0.25 mg by mouth at bedtime.         . cephALEXin (KEFLEX) 250 MG capsule   Oral   Take by mouth at bedtime.         . docusate sodium (COLACE) 100 MG capsule   Oral   Take 100 mg by mouth 2 (two) times daily.         Marland Kitchen escitalopram (LEXAPRO) 20 MG tablet   Oral   Take 20 mg by mouth daily.         . furosemide (LASIX) 40 MG tablet   Oral   Take 40 mg by mouth.         . gabapentin (NEURONTIN) 300 MG capsule   Oral   Take 300 mg by mouth at bedtime.         . lamoTRIgine (LAMICTAL) 100 MG tablet   Oral   Take 100 mg by mouth 2 (two) times daily.         Marland Kitchen latanoprost (XALATAN) 0.005  % ophthalmic solution      1 drop at bedtime.         . metoprolol tartrate (LOPRESSOR) 25 MG tablet   Oral   Take 25 mg by mouth 2 (two) times daily.         . mirabegron ER (MYRBETRIQ) 50 MG TB24 tablet   Oral   Take 50 mg by mouth daily.         . Omega-3 Fatty Acids (FISH OIL) 1000 MG CAPS   Oral   Take 1,000 mg by mouth 2 (two) times daily.         Marland Kitchen oxybutynin (DITROPAN-XL) 5 MG 24 hr tablet   Oral   Take 5 mg by mouth at bedtime.         . prochlorperazine (COMPAZINE) 5 MG tablet   Oral   Take 5 mg by mouth every 6 (six) hours as needed for nausea or vomiting.         . psyllium (METAMUCIL) 58.6 %  powder   Oral   Take 1 packet by mouth 3 (three) times daily.         Marland Kitchen warfarin (COUMADIN) 7.5 MG tablet   Oral   Take 7.5 mg by mouth daily.           Allergies Nystatin  History reviewed. No pertinent family history.  Social History Social History  Substance Use Topics  . Smoking status: Never Smoker   . Smokeless tobacco: None  . Alcohol Use: No    Review of Systems  Constitutional: Negative for fever. Eyes: Negative for visual changes. ENT: Negative for sore throat. Cardiovascular: Negative for chest pain. Respiratory: Negative for shortness of breath. Gastrointestinal: Negative for abdominal pain, vomiting and diarrhea. Genitourinary: Negative for dysuria. Musculoskeletal: Negative for back pain. Skin: Negative for rash. Neurological: Positive for headache   10-point ROS otherwise negative.  ____________________________________________   PHYSICAL EXAM:  VITAL SIGNS: ED Triage Vitals  Enc Vitals Group     BP 07/06/15 0615 175/88 mmHg     Pulse Rate 07/06/15 0615 60     Resp 07/06/15 0615 16     Temp 07/06/15 0615 97.7 F (36.5 C)     Temp Source 07/06/15 0615 Oral     SpO2 07/06/15 0615 95 %     Weight --      Height --      Head Cir --      Peak Flow --      Pain Score 07/06/15 0616 6     Pain Loc --      Pain  Edu? --      Excl. in [place]? --      Constitutional: Alert and oriented. Well appearing and in no distress. Eyes: Conjunctivae are normal. PERRL. Normal extraocular movements. ENT   Head: Normocephalic and atraumatic.   Nose: No congestion/rhinnorhea.   Mouth/Throat: Mucous membranes are moist.   Neck: No stridor. Hematological/Lymphatic/Immunilogical: No cervical lymphadenopathy. Cardiovascular: Normal rate, regular rhythm. Normal and symmetric distal pulses are present in all extremities. No murmurs, rubs, or gallops. Respiratory: Normal respiratory effort without tachypnea nor retractions. Breath sounds are clear and equal bilaterally. No wheezes/rales/rhonchi. Gastrointestinal: Soft and nontender. No distention. There is no CVA tenderness. Genitourinary: deferred Musculoskeletal: Nontender with normal range of motion in all extremities. No joint effusions.  No lower extremity tenderness nor edema. Neurologic:  Normal speech and language. No gross focal neurologic deficits are appreciated. Speech is normal.  Skin:  Skin is warm, dry and intact. No rash noted. Psychiatric: Mood and affect are normal. Speech and behavior are normal. Patient exhibits appropriate insight and judgment.      RADIOLOGY  CT Head Wo Contrast (Final result) Result time: 07/06/15 06:48:53   Final result by Rad Results In Interface (07/06/15 06:48:53)   Narrative:   CLINICAL DATA: Golden Circle backward while going to the bathroom, striking head.  EXAM: CT HEAD WITHOUT CONTRAST  CT CERVICAL SPINE WITHOUT CONTRAST  TECHNIQUE: Multidetector CT imaging of the head and cervical spine was performed following the standard protocol without intravenous contrast. Multiplanar CT image reconstructions of the cervical spine were also generated.  COMPARISON: None.  FINDINGS: CT HEAD FINDINGS  There is a 2.5 cm right parasellar mass. It appears homogeneous and extra- axial. This may be a meningioma.  An aneurysm is not excluded but less likely.  There is no intracranial hemorrhage. There is moderate generalized atrophy. There is moderate white matter hypodensity which likely represents chronic small vessel ischemic disease.  The  calvarium and skullbase are intact. Visible portions of the orbits and paranasal sinuses are unremarkable.  CT CERVICAL SPINE FINDINGS  The cervical vertebrae are intact, with no evidence of acute fracture. Facet articulations are intact. Pedicles are intact.  There are extensive degenerative disc and facet changes throughout the cervical spine.  At the bottom of this scan, there is a suggestion of a pretracheal mediastinal node measuring 1.5 cm. This is incompletely imaged.  IMPRESSION: 1. Negative for acute intracranial traumatic injury. There is moderate generalized atrophy and chronic white matter hypodensity, likely small vessel ischemic disease. 2. 2.5 cm homogeneous right parasellar mass or aneurysm. Brain MRI with contrast recommended for optimal characterization. These results were called by telephone at the time of interpretation on 07/06/2015 at 6:47 am to Dr. Marjean Donna , who verbally acknowledged these results. 3. Negative for acute cervical spine fracture.   Electronically Signed By: Andreas Newport M.D. On: 07/06/2015 06:47          CT Cervical Spine Wo Contrast (Final result) Result time: 07/06/15 06:48:53   Final result by Rad Results In Interface (07/06/15 06:48:53)   Narrative:   CLINICAL DATA: Golden Circle backward while going to the bathroom, striking head.  EXAM: CT HEAD WITHOUT CONTRAST  CT CERVICAL SPINE WITHOUT CONTRAST  TECHNIQUE: Multidetector CT imaging of the head and cervical spine was performed following the standard protocol without intravenous contrast. Multiplanar CT image reconstructions of the cervical spine were also generated.  COMPARISON: None.  FINDINGS: CT HEAD FINDINGS  There  is a 2.5 cm right parasellar mass. It appears homogeneous and extra- axial. This may be a meningioma. An aneurysm is not excluded but less likely.  There is no intracranial hemorrhage. There is moderate generalized atrophy. There is moderate white matter hypodensity which likely represents chronic small vessel ischemic disease.  The calvarium and skullbase are intact. Visible portions of the orbits and paranasal sinuses are unremarkable.  CT CERVICAL SPINE FINDINGS  The cervical vertebrae are intact, with no evidence of acute fracture. Facet articulations are intact. Pedicles are intact.  There are extensive degenerative disc and facet changes throughout the cervical spine.  At the bottom of this scan, there is a suggestion of a pretracheal mediastinal node measuring 1.5 cm. This is incompletely imaged.  IMPRESSION: 1. Negative for acute intracranial traumatic injury. There is moderate generalized atrophy and chronic white matter hypodensity, likely small vessel ischemic disease. 2. 2.5 cm homogeneous right parasellar mass or aneurysm. Brain MRI with contrast recommended for optimal characterization. These results were called by telephone at the time of interpretation on 07/06/2015 at 6:47 am to Dr. Marjean Donna , who verbally acknowledged these results. 3. Negative for acute cervical spine fracture.   Electronically Signed By: Andreas Newport M.D. On: 07/06/2015 06:47      Procedures   INITIAL IMPRESSION / ASSESSMENT AND PLAN / ED COURSE  Pertinent labs & imaging results that were available during my care of the patient were reviewed by me and considered in my medical decision making (see chart for details).  Given CT scan findings concern for possible aneurysm versus mass MRI of the brain will be performed as recommended by neurology. Patient's care transferred to Dr. Corky Downs at 7:30 AM  ____________________________________________   FINAL CLINICAL  IMPRESSION(S) / ED DIAGNOSES  Final diagnoses:  Head injury, initial encounter  Brain aneurysm      Gregor Hams, MD 07/07/15 [phone]

## 2015-07-06 NOTE — ED Notes (Signed)
Report called to Santiago Glad at [facility]

## 2015-07-06 NOTE — ED Notes (Signed)
Attempted to call report to [facility], RN not available and will call back.

## 2015-07-06 NOTE — Discharge Instructions (Signed)
Head Injury, Adult °You have a head injury. Headaches and throwing up (vomiting) are common after a head injury. It should be easy to wake up from sleeping. Sometimes you must stay in the hospital. Most problems happen within the first 24 hours. Side effects may occur up to 7-10 days after the injury.  °WHAT ARE THE TYPES OF HEAD INJURIES? °Head injuries can be as minor as a bump. Some head injuries can be more severe. More severe head injuries include: °· A jarring injury to the brain (concussion). °· A bruise of the brain (contusion). This mean there is bleeding in the brain that can cause swelling. °· A cracked skull (skull fracture). °· Bleeding in the brain that collects, clots, and forms a bump (hematoma). °WHEN SHOULD I GET HELP RIGHT AWAY?  °· You are confused or sleepy. °· You cannot be woken up. °· You feel sick to your stomach (nauseous) or keep throwing up (vomiting). °· Your dizziness or unsteadiness is getting worse. °· You have very bad, lasting headaches that are not helped by medicine. Take medicines only as told by your doctor. °· You cannot use your arms or legs like normal. °· You cannot walk. °· You notice changes in the black spots in the center of the colored part of your eye (pupil). °· You have clear or bloody fluid coming from your nose or ears. °· You have trouble seeing. °During the next 24 hours after the injury, you must stay with someone who can watch you. This person should get help right away (call 911 in the [place] if you start to shake and are not able to control it (have seizures), you pass out, or you are unable to wake up. °HOW CAN I PREVENT A HEAD INJURY IN THE FUTURE? °· Wear seat belts. °· Wear a helmet while bike riding and playing sports like football. °· Stay away from dangerous activities around the house. °WHEN CAN I RETURN TO NORMAL ACTIVITIES AND ATHLETICS? °See your doctor before doing these activities. You should not do normal activities or play contact sports until 1  week after the following symptoms have stopped: °· Headache that does not go away. °· Dizziness. °· Poor attention. °· Confusion. °· Memory problems. °· Sickness to your stomach or throwing up. °· Tiredness. °· Fussiness. °· Bothered by bright lights or loud noises. °· Anxiousness or depression. °· Restless sleep. °MAKE SURE YOU:  °· Understand these instructions. °· Will watch your condition. °· Will get help right away if you are not doing well or get worse. °  °This information is not intended to replace advice given to you by your health care provider. Make sure you discuss any questions you have with your health care provider. °  °Document Released: 12/06/2007 Document Revised: 01/13/2014 Document Reviewed: 08/30/2012 °Elsevier Interactive Patient Education ©2016 Elsevier Inc. ° °

## 2015-07-06 NOTE — ED Notes (Signed)
Pt presents ED from [facility] for fall while going to the bathroom. Pt states she fell backward, hitting head on at the door. Pt c/o back back of the head pain. Denies other complaints. Pt alerts and oriented x4 at this time.

## 2015-07-06 NOTE — ED Notes (Signed)
Report from [place], [place]

## 2015-07-06 NOTE — ED Provider Notes (Signed)
Asked to follow up on MRI by dr. Owens Shark.   MRI brain concerning for giant aneurysm will discuss with neurosurgery for likely outpatient follow up.   Lavonia Drafts, MD 07/06/15 [phone]

## 2015-07-06 NOTE — ED Notes (Signed)
Blood drawn and sent to lab to hold, light green, lavender, light blue, and red tops.

## 2015-07-25 ENCOUNTER — Emergency Department: Payer: Medicare Other

## 2015-07-25 ENCOUNTER — Inpatient Hospital Stay
Admission: EM | Admit: 2015-07-25 | Discharge: 2015-07-27 | DRG: 064 | Disposition: A | Discharge status: Hospice | Payer: Medicare Other | Attending: Internal Medicine | Admitting: Internal Medicine

## 2015-07-25 ENCOUNTER — Encounter: Payer: Self-pay | Admitting: Emergency Medicine

## 2015-07-25 DIAGNOSIS — Z8249 Family history of ischemic heart disease and other diseases of the circulatory system: Secondary | ICD-10-CM

## 2015-07-25 DIAGNOSIS — R296 Repeated falls: Secondary | ICD-10-CM | POA: Diagnosis present

## 2015-07-25 DIAGNOSIS — Z79899 Other long term (current) drug therapy: Secondary | ICD-10-CM | POA: Diagnosis not present

## 2015-07-25 DIAGNOSIS — Z66 Do not resuscitate: Secondary | ICD-10-CM

## 2015-07-25 DIAGNOSIS — G8191 Hemiplegia, unspecified affecting right dominant side: Secondary | ICD-10-CM | POA: Diagnosis present

## 2015-07-25 DIAGNOSIS — R29727 NIHSS score 27: Secondary | ICD-10-CM | POA: Diagnosis present

## 2015-07-25 DIAGNOSIS — I63512 Cerebral infarction due to unspecified occlusion or stenosis of left middle cerebral artery: Secondary | ICD-10-CM | POA: Diagnosis present

## 2015-07-25 DIAGNOSIS — Z820 Family history of epilepsy and other diseases of the nervous system: Secondary | ICD-10-CM

## 2015-07-25 DIAGNOSIS — K219 Gastro-esophageal reflux disease without esophagitis: Secondary | ICD-10-CM | POA: Diagnosis present

## 2015-07-25 DIAGNOSIS — G9340 Encephalopathy, unspecified: Secondary | ICD-10-CM | POA: Diagnosis present

## 2015-07-25 DIAGNOSIS — R4701 Aphasia: Secondary | ICD-10-CM | POA: Diagnosis present

## 2015-07-25 DIAGNOSIS — Z8551 Personal history of malignant neoplasm of bladder: Secondary | ICD-10-CM

## 2015-07-25 DIAGNOSIS — I4891 Unspecified atrial fibrillation: Secondary | ICD-10-CM | POA: Diagnosis present

## 2015-07-25 DIAGNOSIS — I639 Cerebral infarction, unspecified: Secondary | ICD-10-CM

## 2015-07-25 DIAGNOSIS — Z515 Encounter for palliative care: Secondary | ICD-10-CM

## 2015-07-25 DIAGNOSIS — E785 Hyperlipidemia, unspecified: Secondary | ICD-10-CM | POA: Diagnosis present

## 2015-07-25 DIAGNOSIS — I63412 Cerebral infarction due to embolism of left middle cerebral artery: Secondary | ICD-10-CM | POA: Diagnosis not present

## 2015-07-25 DIAGNOSIS — I671 Cerebral aneurysm, nonruptured: Secondary | ICD-10-CM | POA: Diagnosis present

## 2015-07-25 DIAGNOSIS — E860 Dehydration: Secondary | ICD-10-CM | POA: Diagnosis present

## 2015-07-25 DIAGNOSIS — R471 Dysarthria and anarthria: Secondary | ICD-10-CM | POA: Diagnosis present

## 2015-07-25 DIAGNOSIS — F329 Major depressive disorder, single episode, unspecified: Secondary | ICD-10-CM | POA: Diagnosis present

## 2015-07-25 HISTORY — DX: Cerebral aneurysm, nonruptured: I67.1

## 2015-07-25 LAB — DIFFERENTIAL
BASOS ABS: 0 10*3/uL (ref 0–0.1)
BASOS PCT: 1 %
EOS ABS: 0.1 10*3/uL (ref 0–0.7)
Eosinophils Relative: 3 %
Lymphocytes Relative: 19 %
Lymphs Abs: 0.9 10*3/uL — ABNORMAL LOW (ref 1.0–3.6)
Monocytes Absolute: 0.4 10*3/uL (ref 0.2–0.9)
Monocytes Relative: 9 %
NEUTROS PCT: 68 %
Neutro Abs: 3.2 10*3/uL (ref 1.4–6.5)

## 2015-07-25 LAB — CBC
HCT: 38.3 % (ref 35.0–47.0)
HEMOGLOBIN: 12.6 g/dL (ref 12.0–16.0)
MCH: 32.4 pg (ref 26.0–34.0)
MCHC: 33 g/dL (ref 32.0–36.0)
MCV: 98.5 fL (ref 80.0–100.0)
PLATELETS: 160 10*3/uL (ref 150–440)
RBC: 3.89 MIL/uL (ref 3.80–5.20)
RDW: 14.2 % (ref 11.5–14.5)
WBC: 4.6 10*3/uL (ref 3.6–11.0)

## 2015-07-25 LAB — URINALYSIS COMPLETE WITH MICROSCOPIC (ARMC ONLY)
BACTERIA UA: NONE SEEN
Bilirubin Urine: NEGATIVE
GLUCOSE, UA: NEGATIVE mg/dL
Hgb urine dipstick: NEGATIVE
Ketones, ur: NEGATIVE mg/dL
NITRITE: POSITIVE — AB
Protein, ur: NEGATIVE mg/dL
SPECIFIC GRAVITY, URINE: 1.006 (ref 1.005–1.030)
pH: 6 (ref 5.0–8.0)

## 2015-07-25 LAB — APTT: APTT: 28 s (ref 24–36)

## 2015-07-25 LAB — TROPONIN I: Troponin I: 0.05 ng/mL (ref <0.03)

## 2015-07-25 LAB — COMPREHENSIVE METABOLIC PANEL
ALBUMIN: 3.6 g/dL (ref 3.5–5.0)
ALT: 10 U/L — ABNORMAL LOW (ref 14–54)
ANION GAP: 6 (ref 5–15)
AST: 16 U/L (ref 15–41)
Alkaline Phosphatase: 81 U/L (ref 38–126)
BUN: 23 mg/dL — AB (ref 6–20)
CHLORIDE: 102 mmol/L (ref 101–111)
CO2: 31 mmol/L (ref 22–32)
Calcium: 8.8 mg/dL — ABNORMAL LOW (ref 8.9–10.3)
Creatinine, Ser: 1.1 mg/dL — ABNORMAL HIGH (ref 0.44–1.00)
GFR calc Af Amer: 49 mL/min — ABNORMAL LOW (ref >60)
GFR calc non Af Amer: 42 mL/min — ABNORMAL LOW (ref >60)
GLUCOSE: 92 mg/dL (ref 65–99)
POTASSIUM: 3.9 mmol/L (ref 3.5–5.1)
SODIUM: 139 mmol/L (ref 135–145)
TOTAL PROTEIN: 6.9 g/dL (ref 6.5–8.1)
Total Bilirubin: 0.4 mg/dL (ref 0.3–1.2)

## 2015-07-25 LAB — PROTIME-INR
INR: 1.05
PROTHROMBIN TIME: 13.9 s (ref 11.4–15.0)

## 2015-07-25 LAB — GLUCOSE, CAPILLARY: GLUCOSE-CAPILLARY: 90 mg/dL (ref 65–99)

## 2015-07-25 MED ORDER — HYDRALAZINE HCL 20 MG/ML IJ SOLN
10.0000 mg | Freq: Once | INTRAMUSCULAR | Status: AC
  Administered 2015-07-25: 10 mg via INTRAVENOUS
  Filled 2015-07-25: qty 1

## 2015-07-25 MED ORDER — ATORVASTATIN CALCIUM 20 MG PO TABS
80.0000 mg | ORAL_TABLET | Freq: Every day | ORAL | Status: DC
Start: 2015-07-26 — End: 2015-07-26

## 2015-07-25 MED ORDER — ASPIRIN 300 MG RE SUPP
300.0000 mg | Freq: Once | RECTAL | Status: AC
  Administered 2015-07-25: 300 mg via RECTAL
  Filled 2015-07-25: qty 1

## 2015-07-25 MED ORDER — ASPIRIN 300 MG RE SUPP
300.0000 mg | Freq: Every day | RECTAL | Status: DC
  Administered 2015-07-26 – 2015-07-27 (×2): 300 mg via RECTAL
  Filled 2015-07-25 (×2): qty 1

## 2015-07-25 MED ORDER — DEXTROSE 5 % IV SOLN
1.0000 g | INTRAVENOUS | Status: DC
  Administered 2015-07-26: 20:00:00 1 g via INTRAVENOUS
  Filled 2015-07-25 (×2): qty 10

## 2015-07-25 MED ORDER — DEXTROSE 5 % IV SOLN
1.0000 g | Freq: Once | INTRAVENOUS | Status: AC
  Administered 2015-07-25: 1 g via INTRAVENOUS
  Filled 2015-07-25: qty 10

## 2015-07-25 MED ORDER — SODIUM CHLORIDE 0.9 % IV SOLN
INTRAVENOUS | Status: DC
  Administered 2015-07-26 (×3): via INTRAVENOUS

## 2015-07-25 MED ORDER — IOPAMIDOL (ISOVUE-370) INJECTION 76%
60.0000 mL | Freq: Once | INTRAVENOUS | Status: AC | PRN
  Administered 2015-07-25: 60 mL via INTRAVENOUS

## 2015-07-25 NOTE — ED Notes (Signed)
Patient transported to CT by this RN on cardiac monitor.

## 2015-07-25 NOTE — ED Notes (Signed)
Patient is not eligible for TPA due to hx of brain aneurysm. Teleneuro ended at this time.

## 2015-07-25 NOTE — H&P (Signed)
Admission H&P    Chief Complaint: Right sided weakness and aphasia HPI: Malayjah Otoole is an 82 y.o. female Mykeria Garman is a 82 y.o. female with history of atrial fibrillation, prescribed coumadin which was discontinued around 07/17/15 secondary to a large aneurysm of right ICA and multiple falls, hyperlipidemia, depression, anxiety, bladder cancer who presents for evaluation of right-sided weakness as well as speech disturbance since 3pm.  Apparently the patient was last seen well at 1:30PM after lunch, took a nap and upon waking from her nap, she fell in her room.  The fall was witnessed by her roommate who called for help.  She was found on the ground with right sided weakness and garbled speech.    According to her son she lives in the nursing home since 2013, is usually conversive, walks with a walker (though she falls often and has a motorized chair ordered).  Though she has had UTIs in the past, she has not recently complained of any urinary/abdominal/back pain, fevers, chills. Per records she was otherwise in her usual state of health without illness, no recent vomiting, diarrhea, fevers or chills.  Hospitalist admission was requested and case was discussed with ED Dr. Edd Fabian.  Teleneurology consult appreciated.    History of Stroke: no Date last known well: 07/26/15  Time last known well: 1-1:30PM  tPA Given: no  Past Medical History  Diagnosis Date  . Atrial fibrillation, unspecified   . Hyperlipemia   . Neuropathy ([place])   . Depression   . DDD (degenerative disc disease), cervical   . Anxiety   . Cancer ([place])   . Stroke [facility])     Past Surgical History  Procedure Laterality Date  . Colon resection     Family history:  Mother died of heart disease in her 62s, father died in his 68s.  Sister with obesity, brother with Parkinson's.  Social History: Son reports that she smoked briefly about 30 years ago.    Allergies:  Allergies  Allergen Reactions  . Nystatin      (Not  in a hospital admission)  ROS: Unable to be obtained secondary to patient's mental status - confused, agitated, garbled/incomprehensible speech.    Physical Examination: Blood pressure 152/80, pulse 78, temperature 97.7 F (36.5 C), temperature source Oral, resp. rate 18, height 5' 4"  (1.626 m), weight 90.311 kg (199 lb 1.6 oz), SpO2 92 %. General - WDWN, agitated, snoring but awake HEENT-  Normocephalic, no lesions, without obvious abnormality. Ptosis of the right eye.  Normal external ears. Normal external nose. Mucus membranes dry. Neck supple with no masses, nodes, nodules or enlargement. No bruits heard. Cardiovascular - regular, +S1S2 Lungs - Clear bilaterally, no R/R/W Abdomen - Soft, NTND, no R/G/R Extremities - Nonpitting edema bilateral lower extremities to ankle.  Multiple areas of ecchymoses in various stages of healing.    Neurologic Examination: Awake but lethargic, agitated, complies with some commands such as hand grip. Speech is dysarthric.  RUE 0/5, Moves RLE but strength 1/5 against gravity.   LUE 3/5, Moves LLE on comman and lefts against gravity.    Results for orders placed or performed during the hospital encounter of 07/25/15 (from the past 48 hour(s))  Protime-INR     Status: None   Collection Time: 07/25/15  5:44 PM  Result Value Ref Range   Prothrombin Time 13.9 11.4 - 15.0 seconds   INR 1.05   APTT     Status: None   Collection Time: 07/25/15  5:44 PM  Result Value Ref Range   aPTT 28 24 - 36 seconds  CBC     Status: None   Collection Time: 07/25/15  5:44 PM  Result Value Ref Range   WBC 4.6 3.6 - 11.0 K/uL   RBC 3.89 3.80 - 5.20 MIL/uL   Hemoglobin 12.6 12.0 - 16.0 g/dL   HCT 38.3 35.0 - 47.0 %   MCV 98.5 80.0 - 100.0 fL   MCH 32.4 26.0 - 34.0 pg   MCHC 33.0 32.0 - 36.0 g/dL   RDW 14.2 11.5 - 14.5 %   Platelets 160 150 - 440 K/uL  Differential     Status: Abnormal   Collection Time: 07/25/15  5:44 PM  Result Value Ref Range   Neutrophils  Relative % 68 %   Neutro Abs 3.2 1.4 - 6.5 K/uL   Lymphocytes Relative 19 %   Lymphs Abs 0.9 (L) 1.0 - 3.6 K/uL   Monocytes Relative 9 %   Monocytes Absolute 0.4 0.2 - 0.9 K/uL   Eosinophils Relative 3 %   Eosinophils Absolute 0.1 0 - 0.7 K/uL   Basophils Relative 1 %   Basophils Absolute 0.0 0 - 0.1 K/uL  Comprehensive metabolic panel     Status: Abnormal   Collection Time: 07/25/15  5:44 PM  Result Value Ref Range   Sodium 139 135 - 145 mmol/L   Potassium 3.9 3.5 - 5.1 mmol/L   Chloride 102 101 - 111 mmol/L   CO2 31 22 - 32 mmol/L   Glucose, Bld 92 65 - 99 mg/dL   BUN 23 (H) 6 - 20 mg/dL   Creatinine, Ser 1.10 (H) 0.44 - 1.00 mg/dL   Calcium 8.8 (L) 8.9 - 10.3 mg/dL   Total Protein 6.9 6.5 - 8.1 g/dL   Albumin 3.6 3.5 - 5.0 g/dL   AST 16 15 - 41 U/L   ALT 10 (L) 14 - 54 U/L   Alkaline Phosphatase 81 38 - 126 U/L   Total Bilirubin 0.4 0.3 - 1.2 mg/dL   GFR calc non Af Amer 42 (L) >60 mL/min   GFR calc Af Amer 49 (L) >60 mL/min    Comment: (NOTE) The eGFR has been calculated using the CKD EPI equation. This calculation has not been validated in all clinical situations. eGFR's persistently <60 mL/min signify possible Chronic Kidney Disease.    Anion gap 6 5 - 15  Troponin I     Status: Abnormal   Collection Time: 07/25/15  5:44 PM  Result Value Ref Range   Troponin I 0.05 (HH) <0.03 ng/mL    Comment: CRITICAL RESULT CALLED TO, READ BACK BY AND VERIFIED WITH EMMA HUNTER 07/25/15 @ 1952  MLK   Urinalysis complete, with microscopic ([facility] only)     Status: Abnormal   Collection Time: 07/25/15  5:44 PM  Result Value Ref Range   Color, Urine YELLOW (A) YELLOW   APPearance HAZY (A) CLEAR   Glucose, UA NEGATIVE NEGATIVE mg/dL   Bilirubin Urine NEGATIVE NEGATIVE   Ketones, ur NEGATIVE NEGATIVE mg/dL   Specific Gravity, Urine 1.006 1.005 - 1.030   Hgb urine dipstick NEGATIVE NEGATIVE   pH 6.0 5.0 - 8.0   Protein, ur NEGATIVE NEGATIVE mg/dL   Nitrite POSITIVE (A) NEGATIVE    Leukocytes, UA 3+ (A) NEGATIVE   RBC / HPF 0-5 0 - 5 RBC/hpf   WBC, UA TOO NUMEROUS TO COUNT 0 - 5 WBC/hpf   Bacteria, UA NONE SEEN NONE SEEN   Squamous  Epithelial / LPF 0-5 (A) NONE SEEN   WBC Clumps PRESENT    Ct Angio Head W/cm &/or Wo Cm  07/25/2015  CLINICAL DATA:  Code stroke. Possible CVA. Right-sided weakness and slurred speech. EXAM: CT ANGIOGRAPHY HEAD TECHNIQUE: Multidetector CT imaging of the head was performed using the standard protocol during bolus administration of intravenous contrast. Multiplanar CT image reconstructions and MIPs were obtained to evaluate the vascular anatomy. CONTRAST:  60 mL Isovue 370 COMPARISON:  CTA without contrast from the same day. MRI brain 07/06/2015. FINDINGS: CT HEAD Brain: There is progressive cortical loss within the left temporal lobe suggesting developing infarct. The basal ganglia are intact. The insular ribbon is normal. The moderate diffuse periventricular white matter hypoattenuation is stable. Calvarium and skull base: The calvarium is intact. Paranasal sinuses: The sinuses are clear. Orbits: Bilateral lens replacements are noted. The globes and orbits are otherwise unremarkable. CTA HEAD Anterior circulation: Atherosclerotic calcifications present within the left cavernous internal carotid artery without a significant stenosis. A giant aneurysm extends laterally from the right cavernous sinus measuring 2.4 x 1.8 x 2.3 cm. This incorporates the native vessel. Atherosclerotic calcifications are also present within the cavernous right internal carotid artery. The terminal right ICA is normal. The A1 and M1 segments are normal. A proximal bifurcation is present on the right. The anterior communicating artery is patent. The left anterior cerebral artery is dominant. There is a focal occlusion in the inferior left MCA territory branch corresponding to a potential temporal lobe infarction. There is asymmetric attenuation of more superior left MCA branch is  well, representing more distal disease. Moderate diffuse distal small vessel disease is present in the right ACA branches as well. Posterior circulation: The left vertebral artery is slightly dominant to the right. Dominant AICA vessels are present bilaterally. There is mild narrowing of the right vertebral artery. The vertebrobasilar junction is within normal limits. The basilar artery is small, essentially terminating at the superior cerebellar arteries. Bilateral posterior communicating arteries are present. There is moderate attenuation of distal PCA branch vessels, right greater than left. Venous sinuses: The dural sinuses are patent. The straight sinus and deep cerebral veins are intact. The cortical veins are unremarkable. Anatomic variants: Bilateral fetal type posterior cerebral arteries. Delayed phase:Delayed images best demonstrate the probable left temporal lobe infarct. No pathologic enhancement is present. IMPRESSION: 1. High-grade stenosis or occlusion of the posterior inferior left MCA branch vessel. 2. Probable developing left temporal lobe nonhemorrhagic infarct. 3. 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 The aneurysm measures 2.4 x 1.8 x 2.3 cm. 4. Diffuse distal small vessel disease, more pronounced in the left MCA territory than right. Electronically Signed   By: San Morelle M.D.   On: 07/25/2015 21:01   Dg Chest Portable 1 View  07/25/2015  CLINICAL DATA:  Speech difficulty, right-sided weakness, lethargy, hypertension EXAM: PORTABLE CHEST 1 VIEW COMPARISON:  05/27/2013 FINDINGS: Marked cardiomegaly with central vascular congestion and chronic left basilar scarring versus atelectasis. No definite CHF, focal pneumonia, effusion or pneumothorax. Aorta atherosclerotic and tortuous. Marked degenerative changes of the spine. Bones are osteopenic. IMPRESSION: Stable cardiomegaly with vascular congestion and left basilar atelectasis versus  scarring. Thoracic aortic atherosclerosis and tortuousity Electronically Signed   By: Jerilynn Mages.  Shick M.D.   On: 07/25/2015 18:40   Ct Head Code Stroke W/o Cm  07/25/2015  CLINICAL DATA:  Code stroke.  Stroke-like symptoms began recently. EXAM: CT HEAD WITHOUT CONTRAST TECHNIQUE: Contiguous axial images were obtained  from the base of the skull through the vertex without intravenous contrast. COMPARISON:  CT brain 07/06/2015, MR brain 07/06/2015 FINDINGS: There is no evidence of mass effect, midline shift or extra-axial fluid collections. There is no evidence of a space-occupying lesion or intracranial hemorrhage. There is no evidence of a cortical-based area of acute infarction. 2.6 x 2.2 cm hyperdense mass in the region of the right cavernous sinus. Periventricular white matter low attenuation likely secondary to microvascular disease. Generalized cerebral atrophy. The ventricles and sulci are appropriate for the patient's age. The basal cisterns are patent. Visualized portions of the orbits are unremarkable. The visualized portions of the paranasal sinuses and mastoid air cells are unremarkable. Intracranial atherosclerotic disease is noted. The osseous structures are unremarkable. IMPRESSION: 1. 2.6 x 2.2 cm hyperdense mass in the region of the right cavernous sinus. Differential diagnosis includes meningioma versus aneurysm. Recommend further evaluation with a CTA of the brain. 2. Otherwise, no acute intracranial pathology. Critical Value/emergent results were called by telephone at the time of interpretation on 07/25/2015 at 5:35 pm to Dr. Loura Pardon , who verbally acknowledged these results. Electronically Signed   By: Kathreen Devoid   On: 07/25/2015 17:36   Assessment: 82 y.o. female with Acute CVA, Left MCA, likely embolic due to afib not on AC, history of hyperlipidemia, GERD, anxiety, depression, bladder ca, neuropathy, degenerative disc disease (cervical), chronic right eye ptosis.    Stroke Risk Factors -  Afib, not on anticoagulation, hyperlipidemia  Plan: 1. HgbA1c, fasting lipid panel 2. MRI, MRA  of the brain without contrast 3. PT consult, OT consult, Speech consult 4. Echocardiogram, complete 5. Carotid dopplers 6. Aspirin 332m PR qd, statin when taking PO.  7. Telemetry monitoring 8. Teleneurology consult 9. DNR/DNI - discussed with son/proxy.   Stroke education, treatment, and current plan discussed with patien's son and daughter-in-law.  Camden Mazzaferro 07/25/2015, 10:33 PM

## 2015-07-25 NOTE — ED Notes (Signed)
Code  Stroke  Called  To [phone] 

## 2015-07-25 NOTE — ED Notes (Signed)
In and out completed by this RN assisted by [place], NT. Patient tolerated well. Soiled pants and brief removed. Linens changed. Warm blanket provided.

## 2015-07-25 NOTE — ED Notes (Signed)
Patients mouth swabbed with lemon moisturizer swabs.

## 2015-07-25 NOTE — ED Notes (Signed)
Patient from [place]. Code stroke. Symptoms around "Breakfast time". Right sided weakness and slurred speech. CBG 99. VSS.

## 2015-07-25 NOTE — ED Notes (Signed)
Teleneuro speaking to patient and her son.

## 2015-07-25 NOTE — ED Notes (Signed)
Seizure pad placed to patients right side. Patients right arm and leg are getting caught in between the side rail. Patient has limited controlled over right side. Pads placed for patients protection and safety. HOB 20 degree for improved cerebral perfusion. NSR on monitor.

## 2015-07-25 NOTE — ED Notes (Signed)
Right hand IV site bleeding. Per family, patient has been hitting hand on side rail. New dressing applied. IV still flushes and draws back blood fine. Gauze applied to hand.

## 2015-07-25 NOTE — ED Notes (Signed)
Mouth swabbed by this RN. Snoring respirations still noted. SpO2 95% on RA.

## 2015-07-25 NOTE — ED Notes (Signed)
Attempted to call report x 1  

## 2015-07-25 NOTE — ED Provider Notes (Addendum)
[facility] Emergency Department Provider Note   ____________________________________________  Time seen: Approximately 5:27 PM  I have reviewed the triage vital signs and the nursing notes.   HISTORY  Chief Complaint Code Stroke  Caveat-history of present illness review of systems Limited due to the patient's inability to contribute anything to the history as she is a bit agitated, she has a phasic and dysarthric. Information is obtained from her son as well as staff at [facility] and her roommate who was with her 4 PM.  HPI Martha Mayer is a 82 y.o. female with history of atrial fibrillation, prescribed coumadin but according to [facility] MARthis has not been given since 07/17/15, hyperlipidemia, depression, anxiety who presents for evaluation of right-sided weakness as well as speech disturbance since 4 PM. According to her roommate, the patient was doing well until she suddenly fell in her room at 4 PM. The roommate called for help. When staff found the patient she was on the ground and had speech difficulty as well as right-sided weakness. She is otherwise been in her usual state of health without illness, no recent vomiting, diarrhea, fevers or chills.   Past Medical History  Diagnosis Date  . Atrial fibrillation, unspecified   . Hyperlipemia   . Neuropathy ([place])   . Depression   . DDD (degenerative disc disease), cervical   . Anxiety   . Cancer ([place])   . Stroke [facility])     There are no active problems to display for this patient.   Past Surgical History  Procedure Laterality Date  . Colon resection      Current Outpatient Rx  Name  Route  Sig  Dispense  Refill  . acetaminophen (TYLENOL) 325 MG tablet   Oral   Take 650 mg by mouth 2 (two) times daily.         Marland Kitchen ALPRAZolam (XANAX) 0.25 MG tablet   Oral   Take 0.25 mg by mouth at bedtime.          . cephALEXin (KEFLEX) 250 MG capsule   Oral   Take 250 mg by mouth at  bedtime.          . docusate sodium (COLACE) 100 MG capsule   Oral   Take 100 mg by mouth 2 (two) times daily.         Marland Kitchen escitalopram (LEXAPRO) 20 MG tablet   Oral   Take 20 mg by mouth daily.         . furosemide (LASIX) 40 MG tablet   Oral   Take 40 mg by mouth.         . gabapentin (NEURONTIN) 300 MG capsule   Oral   Take 300 mg by mouth 2 (two) times daily.          Marland Kitchen lamoTRIgine (LAMICTAL) 100 MG tablet   Oral   Take 100 mg by mouth 2 (two) times daily.         Marland Kitchen latanoprost (XALATAN) 0.005 % ophthalmic solution   Left Eye   Place 1 drop into the left eye at bedtime.          . metoprolol tartrate (LOPRESSOR) 25 MG tablet   Oral   Take 25 mg by mouth 2 (two) times daily.         . mirabegron ER (MYRBETRIQ) 50 MG TB24 tablet   Oral   Take 50 mg by mouth daily.         Marland Kitchen  Omega-3 Fatty Acids (FISH OIL) 1000 MG CAPS   Oral   Take 1,000 mg by mouth 2 (two) times daily.         Marland Kitchen oxybutynin (DITROPAN-XL) 5 MG 24 hr tablet   Oral   Take 5 mg by mouth at bedtime.         . Psyllium (REGULOID) 48.57 % POWD   Oral   Take 1 scoop by mouth daily.         . ranitidine (ZANTAC) 75 MG tablet   Oral   Take 75 mg by mouth at bedtime.         . simethicone (MI-ACID GAS RELIEF) 80 MG chewable tablet   Oral   Chew 80 mg by mouth 3 (three) times daily.         . vitamin B-12 (CYANOCOBALAMIN) 1000 MCG tablet   Oral   Take 1,000 mcg by mouth daily.         Marland Kitchen acetaminophen-codeine (TYLENOL #3) 300-30 MG tablet   Oral   Take 1 tablet by mouth every 8 (eight) hours as needed for moderate pain. Patient not taking: Reported on 07/25/2015   15 tablet   0   . prochlorperazine (COMPAZINE) 5 MG tablet   Oral   Take 5 mg by mouth every 6 (six) hours as needed for nausea or vomiting. Reported on 07/25/2015         . warfarin (COUMADIN) 7.5 MG tablet   Oral   Take 7.5 mg by mouth daily. Reported on 07/25/2015            Allergies Nystatin  No family history on file.  Social History Social History  Substance Use Topics  . Smoking status: Never Smoker   . Smokeless tobacco: None  . Alcohol Use: No    Review of Systems   Caveat-history of present illness review of systems Limited due to the patient's inability to contribute anything to the history as she is a bit agitated, she has a phasic and dysarthric. Information is obtained from her son as well as staff at [facility] and her roommate who was with her 4 PM.  ____________________________________________   PHYSICAL EXAM:  Filed Vitals:   07/25/15 1945 07/25/15 2000 07/25/15 2041 07/25/15 2130  BP: 170/98 173/87 134/93 152/80  Pulse: 87 80 71 78  Temp:      TempSrc:      Resp: 14 19 16 18   Height:      Weight:      SpO2: 95% 97% 97% 92%    VITAL SIGNS: ED Triage Vitals  Enc Vitals Group     BP --      Pulse --      Resp --      Temp --      Temp src --      SpO2 --      Weight --      Height --      Head Cir --      Peak Flow --      Pain Score --      Pain Loc --      Pain Edu? --      Excl. in [place]? --     Constitutional: Alert. Mildly agitated at times, moving all extremities equally, speech is unintelligible, does not follow commands to move any extremities. Eyes open spontaneously. She is intermittently tearful. Eyes: Conjunctivae are normal. PERRL. EOMI. Head: Atraumatic. Nose: No congestion/rhinnorhea. Mouth/Throat: Mucous membranes are  dry.  Oropharynx non-erythematous. Neck: No stridor.  Supple without meningismus. No perceived tenderness throughout the midline of the C-spine. Cardiovascular: Normal rate, regular rhythm. Grossly normal heart sounds.  Good peripheral circulation. Respiratory: Normal respiratory effort.  No retractions. Lungs CTAB. Gastrointestinal: Soft and nontender. No distention.  No CVA tenderness. Genitourinary: Deferred Musculoskeletal: No lower extremity tenderness nor edema.  No joint  effusions. Neurologic:  Patient has severe dysarthria, right facial droop. She appears to move all her extremities spontaneously but has notable weakness in the right arm and does not cooperate with formal neurological testing. Skin:  Skin is warm, dry and intact. No rash noted. Psychiatric: Mood and affect are normal. Speech and behavior are normal.  ____________________________________________   LABS (all labs ordered are listed, but only abnormal results are displayed)  Labs Reviewed  DIFFERENTIAL - Abnormal; Notable for the following:    Lymphs Abs 0.9 (*)    All other components within normal limits  COMPREHENSIVE METABOLIC PANEL - Abnormal; Notable for the following:    BUN 23 (*)    Creatinine, Ser 1.10 (*)    Calcium 8.8 (*)    ALT 10 (*)    GFR calc non Af Amer 42 (*)    GFR calc Af Amer 49 (*)    All other components within normal limits  TROPONIN I - Abnormal; Notable for the following:    Troponin I 0.05 (*)    All other components within normal limits  URINALYSIS COMPLETEWITH MICROSCOPIC ([facility] ONLY) - Abnormal; Notable for the following:    Color, Urine YELLOW (*)    APPearance HAZY (*)    Nitrite POSITIVE (*)    Leukocytes, UA 3+ (*)    Squamous Epithelial / LPF 0-5 (*)    All other components within normal limits  CULTURE, BLOOD (ROUTINE X 2)  CULTURE, BLOOD (ROUTINE X 2)  URINE CULTURE  PROTIME-INR  APTT  CBC  CBG MONITORING, ED   ____________________________________________  EKG  ED ECG REPORT I, Joanne Gavel, the attending physician, personally viewed and interpreted this ECG.   Date: 07/25/2015  EKG Time: 17:37  Rate: 69  Rhythm: atrial fibrillation, rate 69  Axis: normal  Intervals:none  ST&T Change: No acute ST elevation acute ST depression. Q-wave in V2.  ____________________________________________  RADIOLOGY  CT head IMPRESSION: 1. 2.6 x 2.2 cm hyperdense mass in the region of the right cavernous sinus. Differential diagnosis  includes meningioma versus aneurysm. Recommend further evaluation with a CTA of the brain. 2. Otherwise, no acute intracranial pathology. Critical Value/emergent results were called by telephone at the time of interpretation on 07/25/2015 at 5:35 pm to Dr. Loura Pardon , who verbally acknowledged these results.   CXR IMPRESSION: Stable cardiomegaly with vascular congestion and left basilar atelectasis versus scarring.  Thoracic aortic atherosclerosis and tortuousity  CTA head  IMPRESSION: 1. High-grade stenosis or occlusion of the posterior inferior left MCA branch vessel. 2. Probable developing left temporal lobe nonhemorrhagic infarct. 3. 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 The aneurysm measures 2.4 x 1.8 x 2.3 cm. 4. Diffuse distal small vessel disease, more pronounced in the left MCA territory than right. ____________________________________________   PROCEDURES  Procedure(s) performed: None  Procedures  Critical Care performed: No  ____________________________________________   INITIAL IMPRESSION / ASSESSMENT AND PLAN / ED COURSE  Pertinent labs & imaging results that were available during my care of the patient were reviewed by me and considered in my medical decision making (  see chart for details).  Martha Mayer is a 82 y.o. female with history of atrial fibrillation, prescribed coumadin but according to [facility] MARthis has not been given since 07/17/15, hyperlipidemia, depression, anxiety who presents for evaluation of right-sided weakness as well as speech disturbance since 4 PM. On exam, she is awake and alert but intermittently agitated, speech unintelligible, severe  dysarthria with left facial droop and right arm weakness concerning for acute CVA. NIH stroke scale is 14. Code stroke initiated, we'll obtain stat CT head and consult the telemetry neurologist on-call, we'll obtain screening labs.  Of note, she is prescribed Coumadin, additionally she had an MRI of her brain obtained on 07/06/2015 showing a giant brain aneurysm versus meningioma, no CTA has been obtained to further evaluate this.   ----------------------------------------- 6:30 PM on 07/25/2015 ----------------------------------------- Telemetry neurologist on-call has evaluated the patient. She was able to speak with additional staff at the patient's living facility and her last known normal time was actually between 1 and 1:30 PM after that she took a nap and after her nap around 4 PM was noted to have the above deficits. She is outside the window for TPA and given her brain aneurysm, she is not a candidate. Telemetry neurologist on call recommends aspirin, admission, CTA of the brain if the patient's creatinine will allow contrasted study. Awaiting CMP. Aspirin ordered.  ----------------------------------------- 9:30 PM on 07/25/2015 ----------------------------------------- CMP was delayed in resulting due to lab error. CT angio has been obtained at this time and there is evidence of high-grade stenosis of the left MCA branch as well as a developing infarct. I discussed this with Dr. Shon Hale of [facility] neurology about whether not he thought the patient would be suitable for a neuro intervention/clot retrieval. He reports that at this time, given that she is greater than 8 hours out from her onset of symptoms, she would not be candidate for intervention. Urinalysis is concerning for possible urinary tract infection, I gave her ceftriaxone. Case discussed with the hospitalist for admission at this time. I also confirmed with her son that she is a DO NOT RESUSCITATE/DO NOT RESUSCITATE CODE STATUS.  ____________________________________________   FINAL CLINICAL IMPRESSION(S) / ED DIAGNOSES  Final diagnoses:  Dysarthria  Aphasia  Cerebral infarction due to unspecified mechanism      NEW MEDICATIONS STARTED DURING THIS  VISIT:  New Prescriptions   No medications on file     Note:  This document was prepared using Dragon voice recognition software and may include unintentional dictation errors.    Joanne Gavel, MD 07/25/15 2134  Joanne Gavel, MD 07/25/15 2230

## 2015-07-25 NOTE — ED Notes (Signed)
Admitting MD at bedside.

## 2015-07-25 NOTE — ED Notes (Signed)
Stroke scale is very limited due to patient unable to follow commands. Patient able to move all extremities but there is no purposeful movement to right side.

## 2015-07-25 NOTE — ED Notes (Signed)
MD made aware of BP

## 2015-07-25 NOTE — ED Notes (Signed)
Attempted to call report x2

## 2015-07-26 ENCOUNTER — Inpatient Hospital Stay (HOSPITAL_COMMUNITY)
Admit: 2015-07-26 | Discharge: 2015-07-26 | Disposition: A | Discharge status: Home / Self Care | Payer: Medicare Other | Attending: Family Medicine | Admitting: Family Medicine

## 2015-07-26 ENCOUNTER — Inpatient Hospital Stay: Payer: Medicare Other

## 2015-07-26 ENCOUNTER — Encounter: Payer: Self-pay | Admitting: *Deleted

## 2015-07-26 ENCOUNTER — Other Ambulatory Visit: Payer: Medicare Other

## 2015-07-26 DIAGNOSIS — I4891 Unspecified atrial fibrillation: Secondary | ICD-10-CM

## 2015-07-26 DIAGNOSIS — Z515 Encounter for palliative care: Secondary | ICD-10-CM

## 2015-07-26 DIAGNOSIS — Z66 Do not resuscitate: Secondary | ICD-10-CM

## 2015-07-26 DIAGNOSIS — I63512 Cerebral infarction due to unspecified occlusion or stenosis of left middle cerebral artery: Secondary | ICD-10-CM

## 2015-07-26 LAB — CBC
HEMATOCRIT: 38.2 % (ref 35.0–47.0)
Hemoglobin: 12.9 g/dL (ref 12.0–16.0)
MCH: 32.7 pg (ref 26.0–34.0)
MCHC: 33.8 g/dL (ref 32.0–36.0)
MCV: 96.8 fL (ref 80.0–100.0)
PLATELETS: 167 10*3/uL (ref 150–440)
RBC: 3.94 MIL/uL (ref 3.80–5.20)
RDW: 14.5 % (ref 11.5–14.5)
WBC: 7.1 10*3/uL (ref 3.6–11.0)

## 2015-07-26 LAB — ECHOCARDIOGRAM COMPLETE
HEIGHTINCHES: 64 in
Weight: 3164.8 oz

## 2015-07-26 LAB — HEMOGLOBIN A1C: HEMOGLOBIN A1C: 6.1 % — AB (ref 4.0–6.0)

## 2015-07-26 LAB — BASIC METABOLIC PANEL
ANION GAP: 7 (ref 5–15)
BUN: 21 mg/dL — AB (ref 6–20)
CALCIUM: 8.9 mg/dL (ref 8.9–10.3)
CO2: 28 mmol/L (ref 22–32)
Chloride: 105 mmol/L (ref 101–111)
Creatinine, Ser: 1.03 mg/dL — ABNORMAL HIGH (ref 0.44–1.00)
GFR calc Af Amer: 53 mL/min — ABNORMAL LOW (ref >60)
GFR calc non Af Amer: 45 mL/min — ABNORMAL LOW (ref >60)
GLUCOSE: 129 mg/dL — AB (ref 65–99)
POTASSIUM: 3.6 mmol/L (ref 3.5–5.1)
Sodium: 140 mmol/L (ref 135–145)

## 2015-07-26 LAB — LIPID PANEL
CHOL/HDL RATIO: 4 ratio
CHOLESTEROL: 212 mg/dL — AB (ref 0–200)
HDL: 53 mg/dL (ref >40)
LDL Cholesterol: 144 mg/dL — ABNORMAL HIGH (ref 0–99)
Triglycerides: 77 mg/dL (ref <150)
VLDL: 15 mg/dL (ref 0–40)

## 2015-07-26 LAB — MRSA PCR SCREENING: MRSA by PCR: NEGATIVE

## 2015-07-26 LAB — TROPONIN I: Troponin I: 0.03 ng/mL (ref <0.03)

## 2015-07-26 MED ORDER — HYDRALAZINE HCL 20 MG/ML IJ SOLN: 10.0000 mg | Freq: Four times a day (QID) | INTRAMUSCULAR | Status: DC | PRN

## 2015-07-26 MED ORDER — HALOPERIDOL LACTATE 5 MG/ML IJ SOLN
1.0000 mg | Freq: Once | INTRAMUSCULAR | Status: AC
  Administered 2015-07-26: 13:00:00 1 mg via INTRAVENOUS
  Filled 2015-07-26: qty 1

## 2015-07-26 MED ORDER — MORPHINE SULFATE (PF) 4 MG/ML IV SOLN
4.0000 mg | INTRAVENOUS | Status: DC | PRN
  Administered 2015-07-26 – 2015-07-27 (×4): 4 mg via INTRAVENOUS
  Filled 2015-07-26 (×4): qty 1

## 2015-07-26 MED ORDER — HALOPERIDOL LACTATE 5 MG/ML IJ SOLN
1.0000 mg | INTRAMUSCULAR | Status: DC | PRN
  Administered 2015-07-27 (×2): 1 mg via INTRAVENOUS
  Filled 2015-07-26 (×2): qty 1

## 2015-07-26 MED ORDER — CETYLPYRIDINIUM CHLORIDE 0.05 % MT LIQD
7.0000 mL | Freq: Two times a day (BID) | OROMUCOSAL | Status: DC
  Administered 2015-07-26 – 2015-07-27 (×4): 7 mL via OROMUCOSAL

## 2015-07-26 NOTE — Care Management Important Message (Signed)
Important Message  Patient Details  Name: Martha Mayer MRN: [id number] Date of Birth: Jun 27, 1919   Medicare Important Message Given:  Yes    Shelbie Ammons, RN 07/26/2015, 1:09 PM

## 2015-07-26 NOTE — Progress Notes (Addendum)
[place] [facility] [place] NAME: Martha Mayer    MR#:  [id number]  DATE OF BIRTH:  10-17-1919  SUBJECTIVE:  CHIEF COMPLAINT:   Chief Complaint  Patient presents with  . Code Stroke   Pt is demented and confused, non-communicative. REVIEW OF SYSTEMS:  Review of Systems  Unable to perform ROS  unable to get.  DRUG ALLERGIES:   Allergies  Allergen Reactions  . Nystatin    VITALS:  Blood pressure 153/47, pulse 70, temperature 97.5 F (36.4 C), temperature source Oral, resp. rate 18, height 5\' 4"  (1.626 m), weight 197 lb 12.8 oz (89.721 kg), SpO2 97 %. PHYSICAL EXAMINATION:  Physical Exam  Constitutional:  Confused and demented.  HENT:  Head: Normocephalic.  Eyes:  chronic right eye ptosis.    Neck: Neck supple. No JVD present.  Cardiovascular: Normal rate and normal heart sounds.   No murmur heard. Pulmonary/Chest: No respiratory distress. She has no wheezes. She has no rales.  Abdominal: She exhibits no distension. There is no tenderness.  Neurological:  Unable to exam. Not follow commands. Confused.  Skin: No rash noted. No erythema.   LABORATORY PANEL:   CBC  Recent Labs Lab 07/26/15 0002  WBC 7.1  HGB 12.9  HCT 38.2  PLT 167   ------------------------------------------------------------------------------------------------------------------ Chemistries   Recent Labs Lab 07/25/15 1744 07/26/15 0002  NA 139 140  K 3.9 3.6  CL 102 105  CO2 31 28  GLUCOSE 92 129*  BUN 23* 21*  CREATININE 1.10* 1.03*  CALCIUM 8.8* 8.9  AST 16  --   ALT 10*  --   ALKPHOS 81  --   BILITOT 0.4  --    RADIOLOGY:  Ct Angio Head W/cm &/or Wo Cm  07/25/2015  CLINICAL DATA:  Code stroke. Possible CVA. Right-sided weakness and slurred speech. EXAM: CT ANGIOGRAPHY HEAD TECHNIQUE: Multidetector CT imaging of the head was performed using the standard protocol during bolus administration of intravenous contrast. Multiplanar CT  image reconstructions and MIPs were obtained to evaluate the vascular anatomy. CONTRAST:  60 mL Isovue 370 COMPARISON:  CTA without contrast from the same day. MRI brain 07/06/2015. FINDINGS: CT HEAD Brain: There is progressive cortical loss within the left temporal lobe suggesting developing infarct. The basal ganglia are intact. The insular ribbon is normal. The moderate diffuse periventricular white matter hypoattenuation is stable. Calvarium and skull base: The calvarium is intact. Paranasal sinuses: The sinuses are clear. Orbits: Bilateral lens replacements are noted. The globes and orbits are otherwise unremarkable. CTA HEAD Anterior circulation: Atherosclerotic calcifications present within the left cavernous internal carotid artery without a significant stenosis. A giant aneurysm extends laterally from the right cavernous sinus measuring 2.4 x 1.8 x 2.3 cm. This incorporates the native vessel. Atherosclerotic calcifications are also present within the cavernous right internal carotid artery. The terminal right ICA is normal. The A1 and M1 segments are normal. A proximal bifurcation is present on the right. The anterior communicating artery is patent. The left anterior cerebral artery is dominant. There is a focal occlusion in the inferior left MCA territory branch corresponding to a potential temporal lobe infarction. There is asymmetric attenuation of more superior left MCA branch is well, representing more distal disease. Moderate diffuse distal small vessel disease is present in the right ACA branches as well. Posterior circulation: The left vertebral artery is slightly dominant to the right. Dominant AICA vessels are present bilaterally. There is mild narrowing of the right vertebral  artery. The vertebrobasilar junction is within normal limits. The basilar artery is small, essentially terminating at the superior cerebellar arteries. Bilateral posterior communicating arteries are present. There is  moderate attenuation of distal PCA branch vessels, right greater than left. Venous sinuses: The dural sinuses are patent. The straight sinus and deep cerebral veins are intact. The cortical veins are unremarkable. Anatomic variants: Bilateral fetal type posterior cerebral arteries. Delayed phase:Delayed images best demonstrate the probable left temporal lobe infarct. No pathologic enhancement is present. IMPRESSION: 1. High-grade stenosis or occlusion of the posterior inferior left MCA branch vessel. 2. Probable developing left temporal lobe nonhemorrhagic infarct. 3. 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 The aneurysm measures 2.4 x 1.8 x 2.3 cm. 4. Diffuse distal small vessel disease, more pronounced in the left MCA territory than right. Electronically Signed   By: San Morelle M.D.   On: 07/25/2015 21:01   US Carotid Bilateral  07/26/2015  CLINICAL DATA:  Left MCA CVA. Syncopal episode. History of hyperlipidemia. EXAM: BILATERAL CAROTID DUPLEX ULTRASOUND TECHNIQUE: Pearline Cables scale imaging, color Doppler and duplex ultrasound were performed of bilateral carotid and vertebral arteries in the neck. COMPARISON:  Head CT - 07/25/2015; brain MRI -07/06/2015 FINDINGS: Criteria: Quantification of carotid stenosis is based on velocity parameters that correlate the residual internal carotid diameter with NASCET-based stenosis levels, using the diameter of the distal internal carotid lumen as the denominator for stenosis measurement. The following velocity measurements were obtained: RIGHT ICA:  87/14 cm/sec CCA:  0000000 cm/sec SYSTOLIC ICA/CCA RATIO:  1.4 DIASTOLIC ICA/CCA RATIO:  1.6 ECA:  92 cm/sec LEFT ICA:  76/7 cm/sec CCA:  [id number] cm/sec SYSTOLIC ICA/CCA RATIO:  1.2 DIASTOLIC ICA/CCA RATIO:  1.1 ECA:  91 cm/sec RIGHT CAROTID ARTERY: There is a minimal amount of eccentric mixed echogenic plaque within the mid and distal aspects of the right common carotid artery (images  8 and 12). There is a very minimal amount of eccentric mixed echogenic plaque involving the origin and proximal aspect the right internal carotid artery (image 26), not resulting in elevated peak systolic velocities within the interrogated course the right internal carotid artery to suggest a hemodynamically significant stenosis. RIGHT VERTEBRAL ARTERY:  Antegrade Flow LEFT CAROTID ARTERY: There is a moderate amount of eccentric mixed echogenic partially shadowing plaque within the left carotid bulb (images 53 and 55), extending to involve the origin and proximal aspects of the left internal carotid artery (image 64, not resulting in elevated peak systolic velocities within the interrogated course of the left internal carotid artery to suggest a hemodynamically significant stenosis. LEFT VERTEBRAL ARTERY:  Not visualized IMPRESSION: 1. Minimal to moderate amount of bilateral atherosclerotic plaque, left greater than right, not resulting in hemodynamically significant stenosis within either internal carotid artery. 2. Non visualization of a patent left vertebral artery - in the absence of prior examinations, this is of uncertain clinical significance as this patient may have congenital absence of the vertebral artery versus age-indeterminate occlusion. Electronically Signed   By: Sandi Mariscal M.D.   On: 07/26/2015 09:50   Dg Chest Portable 1 View  07/25/2015  CLINICAL DATA:  Speech difficulty, right-sided weakness, lethargy, hypertension EXAM: PORTABLE CHEST 1 VIEW COMPARISON:  05/27/2013 FINDINGS: Marked cardiomegaly with central vascular congestion and chronic left basilar scarring versus atelectasis. No definite CHF, focal pneumonia, effusion or pneumothorax. Aorta atherosclerotic and tortuous. Marked degenerative changes of the spine. Bones are osteopenic. IMPRESSION: Stable cardiomegaly with vascular congestion and left basilar atelectasis versus scarring.  Thoracic aortic atherosclerosis and tortuousity  Electronically Signed   By: Jerilynn Mages.  Shick M.D.   On: 07/25/2015 18:40   Ct Head Code Stroke W/o Cm  07/25/2015  CLINICAL DATA:  Code stroke.  Stroke-like symptoms began recently. EXAM: CT HEAD WITHOUT CONTRAST TECHNIQUE: Contiguous axial images were obtained from the base of the skull through the vertex without intravenous contrast. COMPARISON:  CT brain 07/06/2015, MR brain 07/06/2015 FINDINGS: There is no evidence of mass effect, midline shift or extra-axial fluid collections. There is no evidence of a space-occupying lesion or intracranial hemorrhage. There is no evidence of a cortical-based area of acute infarction. 2.6 x 2.2 cm hyperdense mass in the region of the right cavernous sinus. Periventricular white matter low attenuation likely secondary to microvascular disease. Generalized cerebral atrophy. The ventricles and sulci are appropriate for the patient's age. The basal cisterns are patent. Visualized portions of the orbits are unremarkable. The visualized portions of the paranasal sinuses and mastoid air cells are unremarkable. Intracranial atherosclerotic disease is noted. The osseous structures are unremarkable. IMPRESSION: 1. 2.6 x 2.2 cm hyperdense mass in the region of the right cavernous sinus. Differential diagnosis includes meningioma versus aneurysm. Recommend further evaluation with a CTA of the brain. 2. Otherwise, no acute intracranial pathology. Critical Value/emergent results were called by telephone at the time of interpretation on 07/25/2015 at 5:35 pm to Dr. Loura Pardon , who verbally acknowledged these results. Electronically Signed   By: Kathreen Devoid   On: 07/25/2015 17:36   ASSESSMENT AND PLAN:  Acute CVA, Left MCA, likely embolic due to afib not on AC,  Head CT angiogram showed 1. High-grade stenosis or occlusion of the posterior inferior left MCA branch vessel. 2. Probable developing left temporal lobe nonhemorrhagic infarct. 3. Giant aneurysm of the cavernous right internal  carotid artery  MRI, MRA of the brain without contrast, echo are pending. Aspirin and statin for now per Dr. Tyron Russell. Speech study: NPO. IVF support.  Dehydration. IVF support.  history of hyperlipidemia, GERD, anxiety, depression, bladder ca, neuropathy, degenerative disc disease (cervical), chronic right eye ptosis.  Pt has very poor prognosis, palliative care consult.  I discussed with Dr. Tyron Russell and palliative care NP, Stanton Kidney. Pt's son wants to wait for 2 days to decide palliative care or comfort care.  All the records are reviewed and case discussed with Care Management/Social Worker. Management plans discussed with the patient, family and they are in agreement.  CODE STATUS: DNR.  TOTAL TIME TAKING CARE OF THIS PATIENT: 42 minutes.   More than 50% of the time was spent in counseling/coordination of care: YES  POSSIBLE D/C IN 3 DAYS, DEPENDING ON CLINICAL CONDITION.   Demetrios Loll M.D on 07/26/2015 at 1:17 PM  Between 7am to 6pm - Pager - [phone]  After 6pm go to [email or web address] - [employer] [facility]  Office  [phone]  CC: Primary care physician; No primary care provider on file.  Note: This dictation was prepared with Dragon dictation along with smaller phrase technology. Any transcriptional errors that result from this process are unintentional.

## 2015-07-26 NOTE — Evaluation (Signed)
Clinical/Bedside Swallow Evaluation Patient Details  Name: Martha Mayer MRN: [id number] Date of Birth: 1919-08-15  Today's Date: 07/26/2015 Time: SLP Start Time (ACUTE ONLY): 0900 SLP Stop Time (ACUTE ONLY): 1000 SLP Time Calculation (min) (ACUTE ONLY): 60 min  Past Medical History:  Past Medical History  Diagnosis Date  . Atrial fibrillation, unspecified   . Hyperlipemia   . Neuropathy ([place])   . Depression   . DDD (degenerative disc disease), cervical   . Anxiety   . Cancer ([place])   . Stroke ([place])   . Brain aneurysm    Past Surgical History:  Past Surgical History  Procedure Laterality Date  . Colon resection     HPI:      Assessment / Plan / Recommendation Clinical Impression  Pt appears at HIGH risk for aspiration secondary to severe oropharyngeal phase dysphagia noted during this evaluation today. Pt presented w/ little to no lingual movements; tongue in a bunched/back position. When an ice chip was placed anteriorly in the mouth behind the lower teeth, pt exhibited reduced awareness and no lingual movements to manipulate or transfer the ice chip. As it melted somewhat, pt exhibited no immediate pharyngeal swallow to the water/saliva; remanider of ice chip removed to reduce risk for aspiration of the liquid. Pt shook head "no" to oral care attempted; frequent phonations/agitation noted. No further trials attepted to the high risk for aspiration. NSG and MD consulted on the results and recommendations of the eval. ST services will f/u tomorrow w/ ongoing assessment. Recommend strict NPO status w/ oral care for hygiene and oropharyngeal stimulation as pt allows.     Aspiration Risk  Severe aspiration risk;Risk for inadequate nutrition/hydration    Diet Recommendation  NPO; frequent oral care for hygiene and oropharyngeal stimulation as pt allows  Medication Administration: Via alternative means    Other  Recommendations Recommended Consults:  (TBD) Oral Care Recommendations:  Oral care QID;Staff/trained caregiver to provide oral care Other Recommendations:  (TBD)   Follow up Recommendations   (TBD)    Frequency and Duration min 3x week  2 weeks       Prognosis Prognosis for Safe Diet Advancement: Guarded Barriers to Reach Goals: Cognitive deficits;Severity of deficits      Swallow Study   General Date of Onset: 07/25/15 Type of Study: Bedside Swallow Evaluation Previous Swallow Assessment: none indicated Diet Prior to this Study:  (unknown but suspect regluar diet) Temperature Spikes Noted: No Respiratory Status: Room air History of Recent Intubation: No Behavior/Cognition: Alert;Agitated;Confused;Requires cueing (shook head "no" often) Oral Cavity Assessment: Dry Oral Care Completed by SLP:  (attempted but pt shook head "no") Oral Cavity - Dentition: Adequate natural dentition Vision:  (n/a) Self-Feeding Abilities: Total assist Patient Positioning: Upright in bed Baseline Vocal Quality:  (phonations) Volitional Cough: Cognitively unable to elicit Volitional Swallow: Unable to elicit    Oral/Motor/Sensory Function Overall Oral Motor/Sensory Function: Severe impairment Facial ROM: Within Functional Limits Facial Symmetry: Within Functional Limits Lingual ROM: Suspected CN XII (hypoglossal) dysfunction (little to no movements; back position) Lingual Symmetry:  (no movements) Lingual Strength:  (unable to test) Velum: Suspected CN X (Vagus) dysfunction (during inhalations)   Ice Chips Ice chips: Impaired Presentation: Spoon (1 trial) Oral Phase Impairments: Reduced labial seal;Reduced lingual movement/coordination;Poor awareness of bolus Oral Phase Functional Implications:  (no lingual movements; no A-P transit) Pharyngeal Phase Impairments:  (delayed pharyngeal swallow to the melting ice chip/water) Other Comments: ice chip was removed after several seconds   Thin Liquid Thin Liquid: Not tested  Nectar Thick Nectar Thick Liquid: Not tested    Honey Thick Honey Thick Liquid: Not tested   Puree Puree: Not tested   Solid   GO   Solid: Not tested       Orinda Kenner, MS, CCC-SLP  Akina Maish 07/26/2015,3:46 PM

## 2015-07-26 NOTE — Consult Note (Addendum)
Referring Physician: Bridgett Larsson    Chief Complaint: Right sided weakness, aphasia  HPI: Rethia Key is an 82 y.o. female with a history of atrial fibrillation, recently (7/11) taken off anticoagulation, who due to her aphasia is unable to provide any history.  Family not available.  All history obtained from the chart.  The patient on yesterday was last seen well at 1:30PM after lunch, took a nap and upon waking from her nap, she fell in her room. The fall was witnessed by her roommate who called for help. She was found on the ground with right sided weakness and garbled speech. Patient has been in a facility since 2013.  Initial NIHSS of 27.    Date last known well: 07/25/2015 Time last known well: Time: 13:30 tPA Given: No: Outside time window, aneurysms present  Past Medical History  Diagnosis Date  . Atrial fibrillation, unspecified   . Hyperlipemia   . Neuropathy ([place])   . Depression   . DDD (degenerative disc disease), cervical   . Anxiety   . Cancer ([place])   . Stroke ([place])   . Brain aneurysm     Past Surgical History  Procedure Laterality Date  . Colon resection     Family history: Unable to obtain due to aphasia  Social History:  reports that she has never smoked. She does not have any smokeless tobacco history on file. She reports that she does not drink alcohol. Her drug history is not on file.  Allergies:  Allergies  Allergen Reactions  . Nystatin     Medications:  I have reviewed the patient's current medications. Prior to Admission:  Prescriptions prior to admission  Medication Sig Dispense Refill Last Dose  . acetaminophen (TYLENOL) 325 MG tablet Take 650 mg by mouth 2 (two) times daily.   07/25/2015 at 0800  . ALPRAZolam (XANAX) 0.25 MG tablet Take 0.25 mg by mouth at bedtime.    07/24/2015 at 2000  . cephALEXin (KEFLEX) 250 MG capsule Take 250 mg by mouth at bedtime.    07/24/2015 at 2000  . docusate sodium (COLACE) 100 MG capsule Take 100 mg by mouth 2 (two)  times daily.   07/25/2015 at 0800  . escitalopram (LEXAPRO) 20 MG tablet Take 20 mg by mouth daily.   07/24/2015 at 2000  . furosemide (LASIX) 40 MG tablet Take 40 mg by mouth.   07/25/2015 at 0800  . gabapentin (NEURONTIN) 300 MG capsule Take 300 mg by mouth 2 (two) times daily.    07/25/2015 at 0800  . lamoTRIgine (LAMICTAL) 100 MG tablet Take 100 mg by mouth 2 (two) times daily.   07/25/2015 at 0800  . latanoprost (XALATAN) 0.005 % ophthalmic solution Place 1 drop into the left eye at bedtime.    07/24/2015 at 2000  . metoprolol tartrate (LOPRESSOR) 25 MG tablet Take 25 mg by mouth 2 (two) times daily.   07/25/2015 at 0800  . mirabegron ER (MYRBETRIQ) 50 MG TB24 tablet Take 50 mg by mouth daily.   07/25/2015 at 0800  . Omega-3 Fatty Acids (FISH OIL) 1000 MG CAPS Take 1,000 mg by mouth 2 (two) times daily.   07/25/2015 at 0800  . oxybutynin (DITROPAN-XL) 5 MG 24 hr tablet Take 5 mg by mouth at bedtime.   07/24/2015 at 2000  . Psyllium (REGULOID) 48.57 % POWD Take 1 scoop by mouth daily.   07/25/2015 at 0800  . ranitidine (ZANTAC) 75 MG tablet Take 75 mg by mouth at bedtime.   07/24/2015 at  2000  . simethicone (MI-ACID GAS RELIEF) 80 MG chewable tablet Chew 80 mg by mouth 3 (three) times daily.   07/25/2015 at 1400  . vitamin B-12 (CYANOCOBALAMIN) 1000 MCG tablet Take 1,000 mcg by mouth daily.   07/25/2015 at 0800  . acetaminophen-codeine (TYLENOL #3) 300-30 MG tablet Take 1 tablet by mouth every 8 (eight) hours as needed for moderate pain. (Patient not taking: Reported on 07/25/2015) 15 tablet 0 Not Taking at Unknown time  . prochlorperazine (COMPAZINE) 5 MG tablet Take 5 mg by mouth every 6 (six) hours as needed for nausea or vomiting. Reported on 07/25/2015   Not Taking at Unknown time  . warfarin (COUMADIN) 7.5 MG tablet Take 7.5 mg by mouth daily. Reported on 07/25/2015   Not Taking at Unknown time   Scheduled: . antiseptic oral rinse  7 mL Mouth Rinse BID  . aspirin  300 mg Rectal Daily  . cefTRIAXone  (ROCEPHIN)  IV  1 g Intravenous Q24H  . haloperidol lactate  1 mg Intravenous Once    ROS: Unable to obtain secondary to aphasia  Physical Examination: Blood pressure 153/47, pulse 70, temperature 97.5 F (36.4 C), temperature source Oral, resp. rate 18, height 5\' 4"  (1.626 m), weight 89.721 kg (197 lb 12.8 oz), SpO2 97 %.  HEENT-  Normocephalic, no lesions, without obvious abnormality.  Normal external eye and conjunctiva.  Normal TM's bilaterally.  Normal auditory canals and external ears. Normal external nose, mucus membranes and septum.  Normal pharynx. Cardiovascular- S1, S2 normal, pulses palpable throughout   Lungs- chest clear, no wheezing, rales, normal symmetric air entry Abdomen- soft, non-tender; bowel sounds normal; no masses,  no organomegaly Extremities- LE edema Lymph-no adenopathy palpable Musculoskeletal-no joint tenderness, deformity or swelling Skin-warm and dry, no hyperpigmentation, vitiligo, or suspicious lesions  Neurological Examination Mental Status: Alert.  Speech dysarthric and unable to be understood.  Does not follow commands.   Cranial Nerves: II: Discs flat bilaterally; Does not blink to bilateral confrontation but appears to follow examine around the room.  Right pupil unreactive.   III,IV, VI: right ptosis V,VII: Absent right corneal VIII: Unable to test IX,X: gag reflex reduced XI: Unable to test XII: Unable to test Motor: Patient with movement on the right but unable to lift either the arm or leg against gravity.  Lifts left upper and lower extremity against gravity and moves spontaneously.   Sensory: Responds to noxious stimuli in all extremities Deep Tendon Reflexes: 1+ in the upper extremities and absent in the lower extremities.   Plantars: Right: upgoing   Left: upgoing Cerebellar: Unable to perform due to ability to follow commands.   Gait: not tested due to safety concerns      Laboratory Studies:  Basic Metabolic  Panel:  Recent Labs Lab 07/25/15 1744 07/26/15 0002  NA 139 140  K 3.9 3.6  CL 102 105  CO2 31 28  GLUCOSE 92 129*  BUN 23* 21*  CREATININE 1.10* 1.03*  CALCIUM 8.8* 8.9    Liver Function Tests:  Recent Labs Lab 07/25/15 1744  AST 16  ALT 10*  ALKPHOS 81  BILITOT 0.4  PROT 6.9  ALBUMIN 3.6   No results for input(s): LIPASE, AMYLASE in the last 168 hours. No results for input(s): AMMONIA in the last 168 hours.  CBC:  Recent Labs Lab 07/25/15 1744 07/26/15 0002  WBC 4.6 7.1  NEUTROABS 3.2  --   HGB 12.6 12.9  HCT 38.3 38.2  MCV 98.5 96.8  PLT  160 167    Cardiac Enzymes:  Recent Labs Lab 07/25/15 1744 07/26/15 0002  TROPONINI 0.05* 0.03*    BNP: Invalid input(s): POCBNP  CBG:  Recent Labs Lab 07/25/15 1742  GLUCAP 90    Microbiology: Results for orders placed or performed during the hospital encounter of 07/25/15  Blood culture (routine x 2)     Status: None (Preliminary result)   Collection Time: 07/25/15  9:43 PM  Result Value Ref Range Status   Specimen Description BLOOD LEFT ARM  Final   Special Requests BOTTLES DRAWN AEROBIC AND ANAEROBIC 10CC  Final   Culture NO GROWTH < 12 HOURS  Final   Report Status PENDING  Incomplete  Blood culture (routine x 2)     Status: None (Preliminary result)   Collection Time: 07/25/15  9:43 PM  Result Value Ref Range Status   Specimen Description BLOOD RIGHT HAND  Final   Special Requests BOTTLES DRAWN AEROBIC AND ANAEROBIC [place]  Final   Culture NO GROWTH < 12 HOURS  Final   Report Status PENDING  Incomplete  MRSA PCR Screening     Status: None   Collection Time: 07/26/15 12:26 AM  Result Value Ref Range Status   MRSA by PCR NEGATIVE NEGATIVE Final    Comment:        The GeneXpert MRSA Assay (FDA approved for NASAL specimens only), is one component of a comprehensive MRSA colonization surveillance program. It is not intended to diagnose MRSA infection nor to guide or monitor treatment  for MRSA infections.     Coagulation Studies:  Recent Labs  07/25/15 1744  LABPROT 13.9  INR 1.05    Urinalysis:  Recent Labs Lab 07/25/15 1744  COLORURINE YELLOW*  LABSPEC 1.006  PHURINE 6.0  GLUCOSEU NEGATIVE  HGBUR NEGATIVE  BILIRUBINUR NEGATIVE  KETONESUR NEGATIVE  PROTEINUR NEGATIVE  NITRITE POSITIVE*  LEUKOCYTESUR 3+*    Lipid Panel:    Component Value Date/Time   CHOL 212* 07/26/2015 0002   CHOL 201* 05/28/2013 0549   TRIG 77 07/26/2015 0002   TRIG 106 05/28/2013 0549   HDL 53 07/26/2015 0002   HDL 43 05/28/2013 0549   CHOLHDL 4.0 07/26/2015 0002   VLDL 15 07/26/2015 0002   VLDL 21 05/28/2013 0549   LDLCALC 144* 07/26/2015 0002   LDLCALC 137* 05/28/2013 0549    HgbA1C:  Lab Results  Component Value Date   HGBA1C 6.1* 07/26/2015    Urine Drug Screen:  No results found for: LABOPIA, COCAINSCRNUR, LABBENZ, AMPHETMU, THCU, LABBARB  Alcohol Level: No results for input(s): ETH in the last 168 hours.  Other results: EKG: atrial fibrillation, rate 74 bpm.  Imaging: Ct Angio Head W/cm &/or Wo Cm  07/25/2015  CLINICAL DATA:  Code stroke. Possible CVA. Right-sided weakness and slurred speech. EXAM: CT ANGIOGRAPHY HEAD TECHNIQUE: Multidetector CT imaging of the head was performed using the standard protocol during bolus administration of intravenous contrast. Multiplanar CT image reconstructions and MIPs were obtained to evaluate the vascular anatomy. CONTRAST:  60 mL Isovue 370 COMPARISON:  CTA without contrast from the same day. MRI brain 07/06/2015. FINDINGS: CT HEAD Brain: There is progressive cortical loss within the left temporal lobe suggesting developing infarct. The basal ganglia are intact. The insular ribbon is normal. The moderate diffuse periventricular white matter hypoattenuation is stable. Calvarium and skull base: The calvarium is intact. Paranasal sinuses: The sinuses are clear. Orbits: Bilateral lens replacements are noted. The globes and  orbits are otherwise unremarkable. CTA HEAD Anterior  circulation: Atherosclerotic calcifications present within the left cavernous internal carotid artery without a significant stenosis. A giant aneurysm extends laterally from the right cavernous sinus measuring 2.4 x 1.8 x 2.3 cm. This incorporates the native vessel. Atherosclerotic calcifications are also present within the cavernous right internal carotid artery. The terminal right ICA is normal. The A1 and M1 segments are normal. A proximal bifurcation is present on the right. The anterior communicating artery is patent. The left anterior cerebral artery is dominant. There is a focal occlusion in the inferior left MCA territory branch corresponding to a potential temporal lobe infarction. There is asymmetric attenuation of more superior left MCA branch is well, representing more distal disease. Moderate diffuse distal small vessel disease is present in the right ACA branches as well. Posterior circulation: The left vertebral artery is slightly dominant to the right. Dominant AICA vessels are present bilaterally. There is mild narrowing of the right vertebral artery. The vertebrobasilar junction is within normal limits. The basilar artery is small, essentially terminating at the superior cerebellar arteries. Bilateral posterior communicating arteries are present. There is moderate attenuation of distal PCA branch vessels, right greater than left. Venous sinuses: The dural sinuses are patent. The straight sinus and deep cerebral veins are intact. The cortical veins are unremarkable. Anatomic variants: Bilateral fetal type posterior cerebral arteries. Delayed phase:Delayed images best demonstrate the probable left temporal lobe infarct. No pathologic enhancement is present. IMPRESSION: 1. High-grade stenosis or occlusion of the posterior inferior left MCA branch vessel. 2. Probable developing left temporal lobe nonhemorrhagic infarct. 3. 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 The aneurysm measures 2.4 x 1.8 x 2.3 cm. 4. Diffuse distal small vessel disease, more pronounced in the left MCA territory than right. Electronically Signed   By: San Morelle M.D.   On: 07/25/2015 21:01   US Carotid Bilateral  07/26/2015  CLINICAL DATA:  Left MCA CVA. Syncopal episode. History of hyperlipidemia. EXAM: BILATERAL CAROTID DUPLEX ULTRASOUND TECHNIQUE: Pearline Cables scale imaging, color Doppler and duplex ultrasound were performed of bilateral carotid and vertebral arteries in the neck. COMPARISON:  Head CT - 07/25/2015; brain MRI -07/06/2015 FINDINGS: Criteria: Quantification of carotid stenosis is based on velocity parameters that correlate the residual internal carotid diameter with NASCET-based stenosis levels, using the diameter of the distal internal carotid lumen as the denominator for stenosis measurement. The following velocity measurements were obtained: RIGHT ICA:  87/14 cm/sec CCA:  0000000 cm/sec SYSTOLIC ICA/CCA RATIO:  1.4 DIASTOLIC ICA/CCA RATIO:  1.6 ECA:  92 cm/sec LEFT ICA:  76/7 cm/sec CCA:  [id number] cm/sec SYSTOLIC ICA/CCA RATIO:  1.2 DIASTOLIC ICA/CCA RATIO:  1.1 ECA:  91 cm/sec RIGHT CAROTID ARTERY: There is a minimal amount of eccentric mixed echogenic plaque within the mid and distal aspects of the right common carotid artery (images 8 and 12). There is a very minimal amount of eccentric mixed echogenic plaque involving the origin and proximal aspect the right internal carotid artery (image 26), not resulting in elevated peak systolic velocities within the interrogated course the right internal carotid artery to suggest a hemodynamically significant stenosis. RIGHT VERTEBRAL ARTERY:  Antegrade Flow LEFT CAROTID ARTERY: There is a moderate amount of eccentric mixed echogenic partially shadowing plaque within the left carotid bulb (images 53 and 55), extending to involve the origin and proximal aspects of the left  internal carotid artery (image 64, not resulting in elevated peak systolic velocities within the interrogated course of the left internal carotid artery to suggest  a hemodynamically significant stenosis. LEFT VERTEBRAL ARTERY:  Not visualized IMPRESSION: 1. Minimal to moderate amount of bilateral atherosclerotic plaque, left greater than right, not resulting in hemodynamically significant stenosis within either internal carotid artery. 2. Non visualization of a patent left vertebral artery - in the absence of prior examinations, this is of uncertain clinical significance as this patient may have congenital absence of the vertebral artery versus age-indeterminate occlusion. Electronically Signed   By: Sandi Mariscal M.D.   On: 07/26/2015 09:50   Dg Chest Portable 1 View  07/25/2015  CLINICAL DATA:  Speech difficulty, right-sided weakness, lethargy, hypertension EXAM: PORTABLE CHEST 1 VIEW COMPARISON:  05/27/2013 FINDINGS: Marked cardiomegaly with central vascular congestion and chronic left basilar scarring versus atelectasis. No definite CHF, focal pneumonia, effusion or pneumothorax. Aorta atherosclerotic and tortuous. Marked degenerative changes of the spine. Bones are osteopenic. IMPRESSION: Stable cardiomegaly with vascular congestion and left basilar atelectasis versus scarring. Thoracic aortic atherosclerosis and tortuousity Electronically Signed   By: Jerilynn Mages.  Shick M.D.   On: 07/25/2015 18:40   Ct Head Code Stroke W/o Cm  07/25/2015  CLINICAL DATA:  Code stroke.  Stroke-like symptoms began recently. EXAM: CT HEAD WITHOUT CONTRAST TECHNIQUE: Contiguous axial images were obtained from the base of the skull through the vertex without intravenous contrast. COMPARISON:  CT brain 07/06/2015, MR brain 07/06/2015 FINDINGS: There is no evidence of mass effect, midline shift or extra-axial fluid collections. There is no evidence of a space-occupying lesion or intracranial hemorrhage. There is no evidence of a  cortical-based area of acute infarction. 2.6 x 2.2 cm hyperdense mass in the region of the right cavernous sinus. Periventricular white matter low attenuation likely secondary to microvascular disease. Generalized cerebral atrophy. The ventricles and sulci are appropriate for the patient's age. The basal cisterns are patent. Visualized portions of the orbits are unremarkable. The visualized portions of the paranasal sinuses and mastoid air cells are unremarkable. Intracranial atherosclerotic disease is noted. The osseous structures are unremarkable. IMPRESSION: 1. 2.6 x 2.2 cm hyperdense mass in the region of the right cavernous sinus. Differential diagnosis includes meningioma versus aneurysm. Recommend further evaluation with a CTA of the brain. 2. Otherwise, no acute intracranial pathology. Critical Value/emergent results were called by telephone at the time of interpretation on 07/25/2015 at 5:35 pm to Dr. Loura Pardon , who verbally acknowledged these results. Electronically Signed   By: Kathreen Devoid   On: 07/25/2015 17:36    Assessment: 82 y.o. female with a history of atrial fibrillation on ASA who presents with right sided weakness and aphasia.  Patient not an anticoagulation candidate due to falls and aneurysm.  CT, CTA reviewed and shows a possible left temporal lobe infarct, likely embolic in nature.  There is a high grade left MCA branch occlusion/stenosis.  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  Echocardiogram pending.  LDL 144.  A1c 6.1.    Stroke Risk Factors - atrial fibrillation and hyperlipidemia  Plan: 1. PT consult, OT consult 2. Speech consult performed.  Patient unable to take po.  To continue ASA rectally.  Will be unable to start Plavix if or until a GI route established.   3. Telemetry monitoring 4. Frequent neuro checks 5. No po route established therefore unable to address LDL.   6. Goals of care to be addressed with family.     Alexis Goodell,  MD Neurology [phone] 07/26/2015, 11:28 AM

## 2015-07-26 NOTE — Consult Note (Signed)
Consultation Note Date: 07/26/2015   Patient Name: Martha Mayer  DOB: [birth year]  MRN: [id number]  Age / Sex: 82 y.o., female  PCP: No primary care provider on file. Referring Physician: Demetrios Loll, MD  Reason for Consultation: Establishing goals of care and Psychosocial/spiritual support  HPI/Patient Profile: 82 y.o. female  admitted on 07/25/2015 with a history of atrial fibrillation on ASA who presents with right sided weakness and aphasia. Patient not an anticoagulation candidate due to falls and aneurysm. CT, CTA reviewed and shows a possible left temporal lobe infarct, likely embolic in nature. There is a high grade left MCA branch occlusion/stenosis. 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   Family faced with advanced directive decision and anticipatory care needs.   Clinical Assessment and Goals of Care:  This NP Wadie Lessen reviewed medical records, received report from team, assessed the patient and then meet at the patient's bedside along with her son/[facility] decision maker  to discuss diagnosis prognosis, GOC, EOL wishes disposition and options.   A detailed discussion was had today regarding advanced directives.  Concepts specific to code status, artifical feeding and hydration, continued IV antibiotics and rehospitalization was had.  The difference between a aggressive medical intervention path  and a palliative comfort care path for this patient at this time was had.  Values and goals of care important to patient and family were attempted to be elicited.  We discussed comfort feeds with known risk of aspiration.  Concept of Hospice and Palliative Care were discussed    MOST form introduced  Natural trajectory and expectations at EOL were discussed.  Questions and concerns addressed.  Hard Choices booklet left for review. Family encouraged to call with questions or concerns.  PMT  will continue to support holistically.     SUMMARY OF RECOMMENDATIONS    Code Status/Advance Care Planning:  DNR    Palliative Prophylaxis:   Aspiration, Bowel Regimen, Delirium Protocol, Frequent Pain Assessment and Oral Care  Additional Recommendations (Limitations, Scope, Preferences):  At this time family needs "alittle more time" to process the situation, before making decisions regarding aggressiveness of care.  The main focus is comfort.  They will need continued support navigating decsions   Psycho-social/Spiritual:   Desire for further Chaplaincy support:yes  Additional Recommendations: Education on Hospice  Prognosis:   Unable to determine, depends on desire for life prolong interventions   Discharge Planning: To Be Determined      Primary Diagnoses: Present on Admission:  . (Resolved) Cerebrovascular accident (CVA) due to occlusion of left middle cerebral artery ([place])  I have reviewed the medical record, interviewed the patient and family, and examined the patient. The following aspects are pertinent.  Past Medical History  Diagnosis Date  . Atrial fibrillation, unspecified   . Hyperlipemia   . Neuropathy ([place])   . Depression   . DDD (degenerative disc disease), cervical   . Anxiety   . Cancer ([place])   . Stroke ([place])   . Brain aneurysm    Social History  Social History  . Marital Status: Married    Spouse Name: N/A  . Number of Children: N/A  . Years of Education: N/A   Social History Main Topics  . Smoking status: Never Smoker   . Smokeless tobacco: None  . Alcohol Use: No  . Drug Use: None  . Sexual Activity: Not Asked   Other Topics Concern  . None   Social History Narrative   History reviewed. No pertinent family history. Scheduled Meds: . antiseptic oral rinse  7 mL Mouth Rinse BID  . aspirin  300 mg Rectal Daily  . cefTRIAXone (ROCEPHIN)  IV  1 g Intravenous Q24H   Continuous Infusions: . sodium chloride 50 mL/hr at  07/26/15 0710   PRN Meds:.hydrALAZINE Medications Prior to Admission:  Prior to Admission medications   Medication Sig Start Date End Date Taking? Authorizing Provider  acetaminophen (TYLENOL) 325 MG tablet Take 650 mg by mouth 2 (two) times daily.   Yes Historical Provider, MD  ALPRAZolam (XANAX) 0.25 MG tablet Take 0.25 mg by mouth at bedtime.    Yes Historical Provider, MD  cephALEXin (KEFLEX) 250 MG capsule Take 250 mg by mouth at bedtime.    Yes Historical Provider, MD  docusate sodium (COLACE) 100 MG capsule Take 100 mg by mouth 2 (two) times daily.   Yes Historical Provider, MD  escitalopram (LEXAPRO) 20 MG tablet Take 20 mg by mouth daily.   Yes Historical Provider, MD  furosemide (LASIX) 40 MG tablet Take 40 mg by mouth.   Yes Historical Provider, MD  gabapentin (NEURONTIN) 300 MG capsule Take 300 mg by mouth 2 (two) times daily.    Yes Historical Provider, MD  lamoTRIgine (LAMICTAL) 100 MG tablet Take 100 mg by mouth 2 (two) times daily.   Yes Historical Provider, MD  latanoprost (XALATAN) 0.005 % ophthalmic solution Place 1 drop into the left eye at bedtime.    Yes Historical Provider, MD  metoprolol tartrate (LOPRESSOR) 25 MG tablet Take 25 mg by mouth 2 (two) times daily.   Yes Historical Provider, MD  mirabegron ER (MYRBETRIQ) 50 MG TB24 tablet Take 50 mg by mouth daily.   Yes Historical Provider, MD  Omega-3 Fatty Acids (FISH OIL) 1000 MG CAPS Take 1,000 mg by mouth 2 (two) times daily.   Yes Historical Provider, MD  oxybutynin (DITROPAN-XL) 5 MG 24 hr tablet Take 5 mg by mouth at bedtime.   Yes Historical Provider, MD  Psyllium (REGULOID) 48.57 % POWD Take 1 scoop by mouth daily.   Yes Historical Provider, MD  ranitidine (ZANTAC) 75 MG tablet Take 75 mg by mouth at bedtime.   Yes Historical Provider, MD  simethicone (MI-ACID GAS RELIEF) 80 MG chewable tablet Chew 80 mg by mouth 3 (three) times daily.   Yes Historical Provider, MD  vitamin B-12 (CYANOCOBALAMIN) 1000 MCG tablet  Take 1,000 mcg by mouth daily.   Yes Historical Provider, MD  acetaminophen-codeine (TYLENOL #3) 300-30 MG tablet Take 1 tablet by mouth every 8 (eight) hours as needed for moderate pain. Patient not taking: Reported on 07/25/2015 12/02/14   Dannielle Karvonen Menshew, PA-C  prochlorperazine (COMPAZINE) 5 MG tablet Take 5 mg by mouth every 6 (six) hours as needed for nausea or vomiting. Reported on 07/25/2015    Historical Provider, MD  warfarin (COUMADIN) 7.5 MG tablet Take 7.5 mg by mouth daily. Reported on 07/25/2015    Historical Provider, MD   Allergies  Allergen Reactions  . Nystatin    Review of  Systems  Unable to perform ROS: Mental status change    Physical Exam  Vital Signs: BP 153/47 mmHg  Pulse 70  Temp(Src) 97.5 F (36.4 C) (Oral)  Resp 18  Ht 5\' 4"  (1.626 m)  Wt 89.721 kg (197 lb 12.8 oz)  BMI 33.94 kg/m2  SpO2 97% Pain Assessment: PAINAD       SpO2: SpO2: 97 % O2 Device:SpO2: 97 % O2 Flow Rate: .O2 Flow Rate (L/min): 2 L/min  IO: Intake/output summary:  Intake/Output Summary (Last 24 hours) at 07/26/15 1352 Last data filed at 07/26/15 0900  Gross per 24 hour  Intake   1213 ml  Output      0 ml  Net   1213 ml    LBM: Last BM Date:  (unknown; prior to admission) Baseline Weight: Weight: 90.311 kg (199 lb 1.6 oz) Most recent weight: Weight: 89.721 kg (197 lb 12.8 oz)     30 % at best Palliative Assessment/Data: 30 % at best   Discussed with Dr Bridgett Larsson  Time In: 1315 Time Out: 1430 Time Total: 75 min Greater than 50%  of this time was spent counseling and coordinating care related to the above assessment and plan.  Signed by: Wadie Lessen, NP   Please contact Palliative Medicine Team phone at [phone] for questions and concerns.  For individual provider: See Shea Evans

## 2015-07-26 NOTE — Clinical Social Work Note (Signed)
Clinical Social Work Assessment  Patient Details  Name: Martha Mayer MRN: [id number] Date of Birth: 10-20-1919  Date of referral:  07/26/15               Reason for consult:  Facility Placement                Permission sought to share information with:  Family Supports Permission granted to share information::  Yes, Verbal Permission Granted  Name::     Yuliet Needs  Relationship::  Son  Contact Information:  [phone]  Housing/Transportation Living arrangements for the past 2 months:  [place] of Information:  Facility Patient Interpreter Needed:  None Criminal Activity/Legal Involvement Pertinent to Current Situation/Hospitalization:  No - Comment as needed Significant Relationships:  Adult Children Lives with:  Facility Resident Do you feel safe going back to the place where you live?  No (Pt needs a higher level of care. ) Need for family participation in patient care:  Yes (Comment)  Care giving concerns:  No care giving concerns identified.   Social Worker assessment / plan:  CSW met with pt and family to address consult as pt was admitted from [facility] and PT is recommending SNF. CSW introduced herself and explained role of social work. CSW also explained the process of discharging to SNF with the 3 night inpatient qualifying stay. At time of assessment, results were pending of the MRI. Pt's son would like for pt to go to [facility] if pt is for STR. Palliative Care consult pending at time of assessment. CSW will initiate a SNF search. CSW will continue to follow.   Employment status:  Retired Forensic scientist:  Medicare PT Recommendations:  [place] / Referral to community resources:  [place]  Patient/Family's Response to care:  Pt's son and daughter in Sports coach were appreciative of CSW support.   Patient/Family's Understanding of and Emotional Response to Diagnosis, Current Treatment, and  Prognosis:  Pt's son is waiting for results of tests and is open to either course of treatment   Emotional Assessment Appearance:  Appears stated age Attitude/Demeanor/Rapport:  Unable to Assess Affect (typically observed):  Unable to Assess Orientation:  Oriented to Self, Fluctuating Orientation (Suspected and/or reported [place]) Alcohol / Substance use:  Never Used Psych involvement (Current and /or in the community):  No (Comment)  Discharge Needs  Concerns to be addressed:  Adjustment to Illness Readmission within the last 30 days:  No Current discharge risk:  Chronically ill Barriers to Discharge:  Continued Medical Work up   [employer], LCSW 07/26/2015, 3:39 PM

## 2015-07-26 NOTE — Progress Notes (Signed)
°   07/26/15 1800  Clinical Encounter Type  Visited With Patient and family together  Visit Type Follow-up;Other (Comment) (Palliative care referral)  Referral From Nurse  Consult/Referral To Chaplain  Recommendations Follow-up  Spiritual Encounters  Spiritual Needs Prayer  Stress Factors  Patient Stress Factors Health changes  Family Stress Factors Health changes  Advance Directives (For Healthcare)  Does patient have an advance directive? No  The patient worked diligently to communicate with the chaplain.  She was able to verbalize discomfort.  She was attended to by nurses.  She was then able to gesture that her mouth was dry. Patient continued to attempt to emote and communicated that she was not in pain to the chaplain. Chaplain will follow-up throughout the night.

## 2015-07-26 NOTE — Progress Notes (Signed)
*  PRELIMINARY RESULTS* Echocardiogram 2D Echocardiogram has been performed.  Sherrie Sport 07/26/2015, 1:42 PM

## 2015-07-26 NOTE — Evaluation (Signed)
Physical Therapy Evaluation Patient Details Name: Martha Mayer MRN: [id number] DOB: Dec 10, 1919 Today's Date: 07/26/2015   History of Present Illness  Pt is a 82 yr old female with a history of atrial fibrillation on ASA who presents with right sided weakness and aphasia. Patient not an anticoagulation candidate due to falls and aneurysm. CT, CTA reviewed and shows a possible left temporal lobe infarct, likely embolic in nature. There is a high grade left MCA branch occlusion/stenosis. 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    Clinical Impression  Pt dysarthric and difficult to understand, therefore subjective portion of evaluation gathered from family. Per pt's son, pt was living in an ALF, ambulating short distances using RW and then taking seated rest breaks before carrying on. Pt was cognitively in tact, communicative, and did not require physical assist for bathing, toileting, feeding, or dressing.   On evaluation, pt demonstrates intermittent ability to follow simple, one-step commands. Pt demonstrates some degree of muscle contraction in hip flexion/ext, knee flex/ext, and plantarflexion on the RLE, whether on command or spontaneously. These RLE contractions appear voluntary in nature. RUE screened and motion appears more involuntary/tone related in nature, with +1 R elbow flexor tone on the modified ashworth scale. See below for more detail. Pt was assisted to sitting EOB with max Ax2, where she was able to maintain sitting balance without physical assist for ~15 seconds, using LUE for support.   Pt does demonstrate participation with PT, and while she is not following multi-step commands, she does demonstrate intermittent ability to follow one-step commands. Pt would benefit from skilled PT to address noted impairments and functional limitations. Recommend pt discharge to SNF when medically appropriate.     Follow Up Recommendations SNF    Equipment Recommendations   (pt has RW and  electric scooter)    Recommendations for Other Services       Precautions / Restrictions Precautions Precautions: Fall      Mobility  Bed Mobility Overal bed mobility: Needs Assistance;+2 for physical assistance Bed Mobility: Supine to Sit;Sit to Supine Supine to sit: Max assist;+2 for physical assistance   Sit to supine: Max assist;+2 for physical assistance General bed mobility comments: Shows slight participation with BLEs, but mostly relying on max A at both hips and trunk. Able to sit EOB for ~3 minutes. Verbal and tactile cues required for posture in sitting.   Transfers General transfer comment: Did not assess  Ambulation/Gait General Gait Details: Did not assess  Stairs    Wheelchair Mobility    Modified Rankin (Stroke Patients Only)       Balance Overall balance assessment: Needs assistance Sitting-balance support: Single extremity supported (LUE) Sitting balance-Leahy Scale: Fair Sitting balance - Comments: Pt largely requiring min A x1 to sit EOB, but is able to maintain sitting upright for 15 seconds without physical assist (close standby for safety). Does not actively engage RUE for balance. Demonstrates increased 1-step command following (lean forward, look up, lean to your L) in sitting. Postural control: Posterior lean;Left lateral lean     Pertinent Vitals/Pain Pain Assessment: No/denies pain (Shakes head "no" when asked about pain)  HR maintained in 70-low 80's throughout evaluation O2 > 95% BP at end of session: 135/62 mmHg (RN notified)    Home Living Family/patient expects to be discharged to:: Assisted living Home Equipment: Walker - 2 wheels; pt's son reports he has ordered her an Transport planner which has not yet arrived      Prior Function Level of Independence:  Independent with assistive device(s)  Comments: Pt dysarthric and speech is difficult to understand, but pt's son and daughter-in-law were present during my evaluation and able  to speak to the pt's PLOF. They report pt was able to ambulate short distances with RW, stopping to take seated rest breaks with longer bouts. She has been with increasing falls for the past several months. Mod I with ADLs of bathing, toileting, dressing, and feeding.     Hand Dominance        Extremity/Trunk Assessment   Pt with intermittent ability to follow commands, thus decreased capacity to gather true evaluation of strength  LLE: at some point during my evaluation, the pt moved the LLE in all directons (whether on command or spontaneous), demonstrating at least 3/5 hip flex/ext, knee flex/ex, hip abd/add, ankle PF/DF.  RLE: some level of contraction noted in hip flex/ext, knee flex/ext, hip abd, and ankle PF; and all appeared voluntary. With hip/knee flexed at 90/90, pt does actively extend against therapist resistance. No active DF noted, passively able to achieve neutral. *No clonus noted in bilat LEs   LUE: full PROM noted in all directions. Pt does not flex/ext elbow on command, but demonstrates ability to voluntarily move in these directions to reach for son's hands. Does demonstrate grip strength on command. RUE: 1+ flexor tone (modified ashworth scale) noted in R elbow, demonstrated by a catch and release with minimal resistance through the end of the range. Pt able to perform slight flex/ext of elbow, but not on command and unable to ascertain if this is voluntary or involuntary/tone in nature. Some contraction felt when seeking to assess grip strength, but again this may be related to flexor tone and not voluntary. R shoulder PROM with moderate resistance > 90deg, pt's family reporting R shoulder injury resulting in pain with overhead reaching x40 years. Absent sulcus sign.   Cervical / Trunk Assessment: Kyphotic (Kyphotic posturing sitting EOB, unsure if this is baseline)     Communication   Communication: Expressive difficulties;Receptive difficulties (Command following  inconsistent)  Cognition Arousal/Alertness: Awake/alert Behavior During Therapy: Flat affect;Agitated;Restless (Fluctuating between flat affect and restless, with one episode of agitation. Pt swatting at therapist when asked to sit up, easily calmed by son.) Overall Cognitive Status: Impaired/Different from baseline    General Comments Nursing cleared pt for participation in physical therapy.  Pt agreeable to PT session.           Assessment/Plan    PT Assessment Patient needs continued PT services  PT Diagnosis Hemiplegia dominant side   PT Problem List Decreased strength;Decreased range of motion;Decreased balance;Decreased activity tolerance;Decreased mobility;Impaired tone  PT Treatment Interventions Gait training;Functional mobility training;Therapeutic activities;Therapeutic exercise;Balance training;Neuromuscular re-education;Patient/family education   PT Goals (Current goals can be found in the Care Plan section) Acute Rehab PT Goals PT Goal Formulation: Patient unable to participate in goal setting (Son's goal would be return to PLOF) Time For Goal Achievement: 08/09/15 Potential to Achieve Goals: Fair    Frequency 7X/week   Barriers to discharge Assist levels       End of Session  Activity Tolerance: Patient limited by fatigue Patient left: in bed;with call bell/phone within reach;with bed alarm set;with family/visitor present;with SCD's reapplied (RUE elevated and supported on pillows) Nurse Communication: Mobility status;Precautions; Blood Pressure         Time: [id number] PT Time Calculation (min) (ACUTE ONLY): 44 min   Charges:         PT G Codes:  Luberta Grabinski, SPT 07/26/2015, 12:43 PM

## 2015-07-27 MED ORDER — MORPHINE SULFATE (PF) 4 MG/ML IV SOLN
4.0000 mg | INTRAVENOUS | Status: DC | PRN
  Administered 2015-07-27 (×2): 4 mg via INTRAVENOUS
  Filled 2015-07-27 (×2): qty 1

## 2015-07-27 MED ORDER — HALOPERIDOL LACTATE 5 MG/ML IJ SOLN
1.0000 mg | INTRAMUSCULAR | Status: DC | PRN
  Administered 2015-07-27: 13:00:00 1 mg via INTRAVENOUS
  Filled 2015-07-27: qty 1

## 2015-07-27 MED ORDER — HALOPERIDOL LACTATE 5 MG/ML IJ SOLN
1.0000 mg | INTRAMUSCULAR | Status: AC | PRN
Start: 2015-07-27 — End: ?

## 2015-07-27 MED ORDER — MORPHINE SULFATE (PF) 4 MG/ML IV SOLN: 4.0000 mg | INTRAVENOUS | Status: AC | PRN

## 2015-07-27 NOTE — Progress Notes (Signed)
EMS called for transport to hospice home. EMS picked patient up from [facility] at 4:24pm. I attempted to call Hal Morales to update him of status and he did not answer his phone. I then notified Joaquim Lai, daughter in law, via phone that patient had left [facility] and Joaquim Lai stated she and Shanon Brow would meet patient at the hospice home. Thank you for the opportunity to assist in the care of Martha Mayer. Ebbie Ridge MSN, [facility] Liaison

## 2015-07-27 NOTE — Care Management Note (Signed)
Case Management Note  Patient Details  Name: Martha Mayer MRN: [id number] Date of Birth: [birth year]  Subjective/Objective:             Patient to discharge today to hospice home as facilitated by CSW.       Action/Plan:  No CM needs.  Expected Discharge Date:                  Expected Discharge Plan:  [place]  In-House Referral:  Clinical Social Work  Discharge planning Services  CM Consult  Post Acute Care Choice:  NA Choice offered to:  NA  DME Arranged:  N/A DME Agency:  NA  HH Arranged:    [place] Agency:  NA  Status of Service:  Completed, signed off  If discussed at [employer] of Stay Meetings, dates discussed:    Additional Comments:  Carles Collet, RN 07/27/2015, 1:44 PM

## 2015-07-27 NOTE — Clinical Social Work Note (Signed)
Pt was made comfort care this morning. Pt qualifies for hospice. Pt's family chose [facility]. CSW made referral to Hospice and [place]. Hospital Liaision will make arrangements for transfer as a bed is available today. CSW notified MD and RN. Pt is ready for discharge today. CSW provided supportive counseling to pt's son. CSW is signing off as no further needs identified.   Darden Dates, MSW, LCSW Clinical Social Worker  [phone]

## 2015-07-27 NOTE — Progress Notes (Signed)
Nutrition Brief Note  Pt assessed for Low [place]. Chart reviewed. Pt now transitioning to comfort care.  Pt remains NPO. No further nutrition interventions warranted at this time.  Please re-consult as needed.   Dwyane Luo, RD, LDN Pager [phone] Weekend/On-Call Pager [phone]

## 2015-07-27 NOTE — Progress Notes (Signed)
Visit made. Patient is a new referral for hospice home. Update from Martha Dates, LCSW received stating patients family has chosen our hospice home. Martha Mayer states portable DNR is on patients chart. I met with patients son Martha Mayer and daughter-in-law Martha Mayer. Patient lying in bed and is unresponsive. Patient is a 82 year old female who has been a resident at [facility] since 2013 per her son Martha Mayer. She was a Insurance claims handler for many years. Martha Mayer states that on 07-25-15, Martha Mayer had a stroke. Patient has a history of Afib, hyperlipidemia, depression, anxiety, neuropathy, degenerative disc disease, colon cancer, post resection in the 61s per Martha Mayer, bladder cancer post bladder instillation per Martha Mayer. Martha Mayer also states patient had an MRI a few weeks ago that showed a giant aneurysm per Martha Mayer. Martha Mayer stated that prior to the stroke, his mother could walk with a walker. Patient is now bed bound and unresponsive. Skin is ecchymotic but no wounds per nurse, Martha Mayer. Martha Mayer also states patient had a failed swallow study, is not eating or drinking and has edema in the lower extremities. Martha Mayer stated that she has given patient Haldol for agitation and Morphine for shortness of breath. Oxygen is in use at 2lpm via nasal cannula and last recorded 02 saturation was 83%. Patient is incontinent of bowel and bladder. Martha Mayer, nurse is unable to report a last bowel movement date for this patient. Martha Mayer states that he has decided on comfort care for his mother and that he would like her to be transferred to the hospice home today. I explained hospice home services and consents were signed by Martha Mayer. Also clarified questions Martha Mayer had related to financial responsibility for non-emergency transport via phone call with [facility], hospice CFO. Martha Mayer states his wishes for his mother are to for her to be kept comfortable. Plan is for patient to transfer to [place] on 07-27-15. All information faxed to referral intake.  Report called to Martha Mayer, nurse at hospice home. EMS called to transport patient. Martha Mayer, hospice home nurse and I also spoke with patients nurse, Martha Mayer to clarify Haldol and Morphine medication dosages patient is receiving for symptom management and Nystatin allergy prior to discharge from [facility]. I also informed Martha Mayer that patient has prearranged funeral plans with [facility] [facility] in [place]. [phone]. Martha Mayer wanted to make sure everyone was aware of this so Martha Mayer at time of death. Updated Carles Collet, CM and Martha Mayer, CSW. Will call EMS for transport when Mayer patient is ready for transport by Margaretann Loveless, RN. Ebbie Ridge MSN, [facility] Liaison

## 2015-07-27 NOTE — Discharge Instructions (Signed)
Hospice and comfort care

## 2015-07-27 NOTE — Progress Notes (Signed)
PT Cancellation Note  Patient Details Name: Martha Mayer MRN: [id number] DOB: 1919-03-11   Cancelled Treatment:    Reason Eval/Treat Not Completed: Other (comment).  Per verbal discussion with MD Bridgett Larsson, pt now on comfort care.  Per PT protocol, will discontinue PT order.   Raquel Sarna Dozier Berkovich 07/27/2015, 11:41 AM Leitha Bleak, [place]

## 2015-07-27 NOTE — Progress Notes (Signed)
Speech Therapy Note: reviewed chart notes; post verbal discussion w/ MD, pt has been made [place]. Hospice services are initiated. ST services will sign off at this time. Continue to recommend oral care for hygiene and comfort. NSG agreed.

## 2015-07-27 NOTE — Progress Notes (Signed)
Patient discharged to hospice home. EMS will transport.

## 2015-07-27 NOTE — Progress Notes (Signed)
   [place] at [place] NAME: Martha Mayer    MR#:  [id number]  DATE OF BIRTH:  1919/10/12  SUBJECTIVE:  CHIEF COMPLAINT:   Chief Complaint  Patient presents with  . Code Stroke   Pt is unresponsive, on O2 [place].  REVIEW OF SYSTEMS:  Review of Systems  Unable to perform ROS  unable to get.  DRUG ALLERGIES:   Allergies  Allergen Reactions  . Nystatin    VITALS:  Blood pressure 106/86, pulse 88, temperature 98.1 F (36.7 C), temperature source Oral, resp. rate 16, height 5\' 4"  (1.626 m), weight 197 lb 12.8 oz (89.721 kg), SpO2 92 %. PHYSICAL EXAMINATION:  Physical Exam  Constitutional:  Unresponsive to stimuli. HENT:  Head: Normocephalic.  Eyes:  chronic right eye ptosis.    Neck: Neck supple. No JVD present.  Cardiovascular: Normal rate and normal heart sounds.   No murmur heard. Pulmonary/Chest: mild respiratory distress. She has no wheezes. She has no rales.  Abdominal: She exhibits no distension.  Neurological:  Unable to exam. Unresponsive to stimuli. Skin: No rash noted. No erythema.   LABORATORY PANEL:   CBC  Recent Labs Lab 07/26/15 0002  WBC 7.1  HGB 12.9  HCT 38.2  PLT 167   ------------------------------------------------------------------------------------------------------------------ Chemistries   Recent Labs Lab 07/25/15 1744 07/26/15 0002  NA 139 140  K 3.9 3.6  CL 102 105  CO2 31 28  GLUCOSE 92 129*  BUN 23* 21*  CREATININE 1.10* 1.03*  CALCIUM 8.8* 8.9  AST 16  --   ALT 10*  --   ALKPHOS 81  --   BILITOT 0.4  --    RADIOLOGY:  No results found. ASSESSMENT AND PLAN:  Acute CVA, Left MCA, likely embolic due to afib not on AC,  Head CT angiogram showed 1. High-grade stenosis or occlusion of the posterior inferior left MCA branch vessel. 2. Probable developing left temporal lobe nonhemorrhagic infarct. 3. 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 She was treated with Aspirin suppository. Speech study: NPO. IVF support.  Dehydration. Treated with IVF support.  history of hyperlipidemia, GERD, anxiety, depression, bladder ca, neuropathy, degenerative disc disease (cervical), chronic right eye ptosis.  Pt has very poor prognosis. I discussed with her son, who made decision for comfort care. The patient may die anytime.  I discussed with SW and CM for hospice discharge in 1-2 days if she survive today.  All the records are reviewed and case discussed with Care Management/Social Worker. Management plans discussed with the patient's son and he is in agreement.  CODE STATUS: DNR.  TOTAL TIME TAKING CARE OF THIS PATIENT: 39 minutes.   More than 50% of the time was spent in counseling/coordination of care: YES  POSSIBLE D/C IN 1-2 DAYS, DEPENDING ON CLINICAL CONDITION.   Demetrios Loll M.D on 07/27/2015 at 12:37 PM  Between 7am to 6pm - Pager - [phone]  After 6pm go to [email or web address] - Technical brewer [facility]  Office  [phone]  CC: Primary care physician; No primary care provider on file.  Note: This dictation was prepared with Dragon dictation along with smaller phrase technology. Any transcriptional errors that result from this process are unintentional.

## 2015-07-27 NOTE — Discharge Summary (Signed)
[place] at [place] NAME: Martha Mayer    MR#:  [id number]  DATE OF BIRTH:  [birth year]  DATE OF ADMISSION:  07/25/2015 ADMITTING PHYSICIAN: Theodoro Grist, MD  DATE OF DISCHARGE: 07/27/2015  PRIMARY CARE PHYSICIAN: No primary care provider on file.    ADMISSION DIAGNOSIS:  Aphasia [R47.01] Dysarthria [R47.1] Cerebral infarction due to unspecified mechanism [I63.9] Cerebrovascular accident (CVA) due to occlusion of left middle cerebral artery ([place]) [I63.512]  DISCHARGE DIAGNOSIS:  Active Problems:   DNR (do not resuscitate)   Palliative care encounter Acute CVA Acute encephalopathy Dehydration SECONDARY DIAGNOSIS:   Past Medical History  Diagnosis Date  . Atrial fibrillation, unspecified   . Hyperlipemia   . Neuropathy ([place])   . Depression   . DDD (degenerative disc disease), cervical   . Anxiety   . Cancer ([place])   . Stroke ([place])   . Brain aneurysm     HOSPITAL COURSE:  Acute CVA, Left MCA, likely embolic due to afib not on AC,  Acute encephalopathy Head CT angiogram showed 1. High-grade stenosis or occlusion of the posterior inferior left MCA branch vessel. 2. Probable developing left temporal lobe nonhemorrhagic infarct. 3. 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 She was treated with Aspirin suppository. Speech study: NPO. IVF support.  Dehydration. Treated with IVF support.  history of hyperlipidemia, GERD, anxiety, depression, bladder ca, neuropathy, degenerative disc disease (cervical), chronic right eye ptosis.  Pt has very poor prognosis. I discussed with her son, who made decision for comfort care. The patient may die anytime.  I discussed with SW and CM for hospice home.   DISCHARGE CONDITIONS:   Poor prognosis, discharge to hospice home.  CONSULTS OBTAINED:  Treatment Team:  Alexis Goodell, MD  DRUG  ALLERGIES:   Allergies  Allergen Reactions  . Nystatin     DISCHARGE MEDICATIONS:   Current Discharge Medication List    START taking these medications   Details  haloperidol lactate (HALDOL) 5 MG/ML injection Inject 0.2 mLs (1 mg total) into the vein every 2 (two) hours as needed. Qty: 1 mL    morphine 4 MG/ML injection Inject 1 mL (4 mg total) into the vein every hour as needed for severe pain (dyspnea). Refills: 0      STOP taking these medications     acetaminophen (TYLENOL) 325 MG tablet      ALPRAZolam (XANAX) 0.25 MG tablet      cephALEXin (KEFLEX) 250 MG capsule      docusate sodium (COLACE) 100 MG capsule      escitalopram (LEXAPRO) 20 MG tablet      furosemide (LASIX) 40 MG tablet      gabapentin (NEURONTIN) 300 MG capsule      lamoTRIgine (LAMICTAL) 100 MG tablet      latanoprost (XALATAN) 0.005 % ophthalmic solution      metoprolol tartrate (LOPRESSOR) 25 MG tablet      mirabegron ER (MYRBETRIQ) 50 MG TB24 tablet      Omega-3 Fatty Acids (FISH OIL) 1000 MG CAPS      oxybutynin (DITROPAN-XL) 5 MG 24 hr tablet      Psyllium (REGULOID) 48.57 % POWD      ranitidine (ZANTAC) 75 MG tablet      simethicone (MI-ACID GAS RELIEF) 80 MG chewable tablet      vitamin B-12 (CYANOCOBALAMIN) 1000 MCG tablet  acetaminophen-codeine (TYLENOL #3) 300-30 MG tablet      prochlorperazine (COMPAZINE) 5 MG tablet      warfarin (COUMADIN) 7.5 MG tablet          DISCHARGE INSTRUCTIONS:    DIET:  NPO  DISCHARGE CONDITION:  Poor.  ACTIVITY:  Comfort care.  OXYGEN:  Home Oxygen: [place], continuously.  DISCHARGE LOCATION:    If you experience worsening of your admission symptoms, develop shortness of breath, life threatening emergency, suicidal or homicidal thoughts you must seek medical attention immediately by calling 911 or calling your MD immediately  if symptoms less severe.  You Must read complete instructions/literature along with all the  possible adverse reactions/side effects for all the Medicines you take and that have been prescribed to you. Take any new Medicines after you have completely understood and accpet all the possible adverse reactions/side effects.   Please note  You were cared for by a hospitalist during your hospital stay. If you have any questions about your discharge medications or the care you received while you were in the hospital after you are discharged, you can call the unit and asked to speak with the hospitalist on call if the hospitalist that took care of you is not available. Once you are discharged, your primary care physician will handle any further medical issues. Please note that NO REFILLS for any discharge medications will be authorized once you are discharged, as it is imperative that you return to your primary care physician (or establish a relationship with a primary care physician if you do not have one) for your aftercare needs so that they can reassess your need for medications and monitor your lab values.    On the day of Discharge:  VITAL SIGNS:  Blood pressure 106/86, pulse 88, temperature 98.1 F (36.7 C), temperature source Oral, resp. rate 16, height 5\' 4"  (1.626 m), weight 197 lb 12.8 oz (89.721 kg), SpO2 92 %.  PHYSICAL EXAMINATION:  See progress note. DATA REVIEW:   CBC  Recent Labs Lab 07/26/15 0002  WBC 7.1  HGB 12.9  HCT 38.2  PLT 167    Chemistries   Recent Labs Lab 07/25/15 1744 07/26/15 0002  NA 139 140  K 3.9 3.6  CL 102 105  CO2 31 28  GLUCOSE 92 129*  BUN 23* 21*  CREATININE 1.10* 1.03*  CALCIUM 8.8* 8.9  AST 16  --   ALT 10*  --   ALKPHOS 81  --   BILITOT 0.4  --     Cardiac Enzymes  Recent Labs Lab 07/26/15 0002  TROPONINI 0.03*    Microbiology Results  Results for orders placed or performed during the hospital encounter of 07/25/15  Urine culture     Status: Abnormal (Preliminary result)   Collection Time: 07/25/15  5:44 PM    Result Value Ref Range Status   Specimen Description URINE, CLEAN CATCH  Final   Special Requests NONE  Final   Culture >=100,000 COLONIES/mL GRAM NEGATIVE RODS (A)  Final   Report Status PENDING  Incomplete  Blood culture (routine x 2)     Status: None (Preliminary result)   Collection Time: 07/25/15  9:43 PM  Result Value Ref Range Status   Specimen Description BLOOD LEFT ARM  Final   Special Requests BOTTLES DRAWN AEROBIC AND ANAEROBIC 10CC  Final   Culture NO GROWTH 1 DAY  Final   Report Status PENDING  Incomplete  Blood culture (routine x 2)     Status:  None (Preliminary result)   Collection Time: 07/25/15  9:43 PM  Result Value Ref Range Status   Specimen Description BLOOD RIGHT HAND  Final   Special Requests BOTTLES DRAWN AEROBIC AND ANAEROBIC [place]  Final   Culture NO GROWTH 1 DAY  Final   Report Status PENDING  Incomplete  MRSA PCR Screening     Status: None   Collection Time: 07/26/15 12:26 AM  Result Value Ref Range Status   MRSA by PCR NEGATIVE NEGATIVE Final    Comment:        The GeneXpert MRSA Assay (FDA approved for NASAL specimens only), is one component of a comprehensive MRSA colonization surveillance program. It is not intended to diagnose MRSA infection nor to guide or monitor treatment for MRSA infections.     RADIOLOGY:  Ct Angio Head W/cm &/or Wo Cm  07/25/2015  CLINICAL DATA:  Code stroke. Possible CVA. Right-sided weakness and slurred speech. EXAM: CT ANGIOGRAPHY HEAD TECHNIQUE: Multidetector CT imaging of the head was performed using the standard protocol during bolus administration of intravenous contrast. Multiplanar CT image reconstructions and MIPs were obtained to evaluate the vascular anatomy. CONTRAST:  60 mL Isovue 370 COMPARISON:  CTA without contrast from the same day. MRI brain 07/06/2015. FINDINGS: CT HEAD Brain: There is progressive cortical loss within the left temporal lobe suggesting developing infarct. The basal ganglia are intact. The  insular ribbon is normal. The moderate diffuse periventricular white matter hypoattenuation is stable. Calvarium and skull base: The calvarium is intact. Paranasal sinuses: The sinuses are clear. Orbits: Bilateral lens replacements are noted. The globes and orbits are otherwise unremarkable. CTA HEAD Anterior circulation: Atherosclerotic calcifications present within the left cavernous internal carotid artery without a significant stenosis. A giant aneurysm extends laterally from the right cavernous sinus measuring 2.4 x 1.8 x 2.3 cm. This incorporates the native vessel. Atherosclerotic calcifications are also present within the cavernous right internal carotid artery. The terminal right ICA is normal. The A1 and M1 segments are normal. A proximal bifurcation is present on the right. The anterior communicating artery is patent. The left anterior cerebral artery is dominant. There is a focal occlusion in the inferior left MCA territory branch corresponding to a potential temporal lobe infarction. There is asymmetric attenuation of more superior left MCA branch is well, representing more distal disease. Moderate diffuse distal small vessel disease is present in the right ACA branches as well. Posterior circulation: The left vertebral artery is slightly dominant to the right. Dominant AICA vessels are present bilaterally. There is mild narrowing of the right vertebral artery. The vertebrobasilar junction is within normal limits. The basilar artery is small, essentially terminating at the superior cerebellar arteries. Bilateral posterior communicating arteries are present. There is moderate attenuation of distal PCA branch vessels, right greater than left. Venous sinuses: The dural sinuses are patent. The straight sinus and deep cerebral veins are intact. The cortical veins are unremarkable. Anatomic variants: Bilateral fetal type posterior cerebral arteries. Delayed phase:Delayed images best demonstrate the probable  left temporal lobe infarct. No pathologic enhancement is present. IMPRESSION: 1. High-grade stenosis or occlusion of the posterior inferior left MCA branch vessel. 2. Probable developing left temporal lobe nonhemorrhagic infarct. 3. 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 The aneurysm measures 2.4 x 1.8 x 2.3 cm. 4. Diffuse distal small vessel disease, more pronounced in the left MCA territory than right. Electronically Signed   By: San Morelle M.D.   On: 07/25/2015 21:01  US Carotid Bilateral  07/26/2015  CLINICAL DATA:  Left MCA CVA. Syncopal episode. History of hyperlipidemia. EXAM: BILATERAL CAROTID DUPLEX ULTRASOUND TECHNIQUE: Pearline Cables scale imaging, color Doppler and duplex ultrasound were performed of bilateral carotid and vertebral arteries in the neck. COMPARISON:  Head CT - 07/25/2015; brain MRI -07/06/2015 FINDINGS: Criteria: Quantification of carotid stenosis is based on velocity parameters that correlate the residual internal carotid diameter with NASCET-based stenosis levels, using the diameter of the distal internal carotid lumen as the denominator for stenosis measurement. The following velocity measurements were obtained: RIGHT ICA:  87/14 cm/sec CCA:  0000000 cm/sec SYSTOLIC ICA/CCA RATIO:  1.4 DIASTOLIC ICA/CCA RATIO:  1.6 ECA:  92 cm/sec LEFT ICA:  76/7 cm/sec CCA:  [id number] cm/sec SYSTOLIC ICA/CCA RATIO:  1.2 DIASTOLIC ICA/CCA RATIO:  1.1 ECA:  91 cm/sec RIGHT CAROTID ARTERY: There is a minimal amount of eccentric mixed echogenic plaque within the mid and distal aspects of the right common carotid artery (images 8 and 12). There is a very minimal amount of eccentric mixed echogenic plaque involving the origin and proximal aspect the right internal carotid artery (image 26), not resulting in elevated peak systolic velocities within the interrogated course the right internal carotid artery to suggest a hemodynamically significant stenosis. RIGHT  VERTEBRAL ARTERY:  Antegrade Flow LEFT CAROTID ARTERY: There is a moderate amount of eccentric mixed echogenic partially shadowing plaque within the left carotid bulb (images 53 and 55), extending to involve the origin and proximal aspects of the left internal carotid artery (image 64, not resulting in elevated peak systolic velocities within the interrogated course of the left internal carotid artery to suggest a hemodynamically significant stenosis. LEFT VERTEBRAL ARTERY:  Not visualized IMPRESSION: 1. Minimal to moderate amount of bilateral atherosclerotic plaque, left greater than right, not resulting in hemodynamically significant stenosis within either internal carotid artery. 2. Non visualization of a patent left vertebral artery - in the absence of prior examinations, this is of uncertain clinical significance as this patient may have congenital absence of the vertebral artery versus age-indeterminate occlusion. Electronically Signed   By: Sandi Mariscal M.D.   On: 07/26/2015 09:50   Dg Chest Portable 1 View  07/25/2015  CLINICAL DATA:  Speech difficulty, right-sided weakness, lethargy, hypertension EXAM: PORTABLE CHEST 1 VIEW COMPARISON:  05/27/2013 FINDINGS: Marked cardiomegaly with central vascular congestion and chronic left basilar scarring versus atelectasis. No definite CHF, focal pneumonia, effusion or pneumothorax. Aorta atherosclerotic and tortuous. Marked degenerative changes of the spine. Bones are osteopenic. IMPRESSION: Stable cardiomegaly with vascular congestion and left basilar atelectasis versus scarring. Thoracic aortic atherosclerosis and tortuousity Electronically Signed   By: Jerilynn Mages.  Shick M.D.   On: 07/25/2015 18:40   Ct Head Code Stroke W/o Cm  07/25/2015  CLINICAL DATA:  Code stroke.  Stroke-like symptoms began recently. EXAM: CT HEAD WITHOUT CONTRAST TECHNIQUE: Contiguous axial images were obtained from the base of the skull through the vertex without intravenous contrast.  COMPARISON:  CT brain 07/06/2015, MR brain 07/06/2015 FINDINGS: There is no evidence of mass effect, midline shift or extra-axial fluid collections. There is no evidence of a space-occupying lesion or intracranial hemorrhage. There is no evidence of a cortical-based area of acute infarction. 2.6 x 2.2 cm hyperdense mass in the region of the right cavernous sinus. Periventricular white matter low attenuation likely secondary to microvascular disease. Generalized cerebral atrophy. The ventricles and sulci are appropriate for the patient's age. The basal cisterns are patent. Visualized portions of the orbits are unremarkable. The visualized portions  of the paranasal sinuses and mastoid air cells are unremarkable. Intracranial atherosclerotic disease is noted. The osseous structures are unremarkable. IMPRESSION: 1. 2.6 x 2.2 cm hyperdense mass in the region of the right cavernous sinus. Differential diagnosis includes meningioma versus aneurysm. Recommend further evaluation with a CTA of the brain. 2. Otherwise, no acute intracranial pathology. Critical Value/emergent results were called by telephone at the time of interpretation on 07/25/2015 at 5:35 pm to Dr. Loura Pardon , who verbally acknowledged these results. Electronically Signed   By: Kathreen Devoid   On: 07/25/2015 17:36     Management plans discussed with the patient's son and he is in agreement.  CODE STATUS:     Code Status Orders        Start     Ordered   07/25/15 2346  Do not attempt resuscitation (DNR)   Continuous    Question Answer Comment  In the event of cardiac or respiratory ARREST Do not call a "code blue"   In the event of cardiac or respiratory ARREST Do not perform Intubation, CPR, defibrillation or ACLS   In the event of cardiac or respiratory ARREST Use medication by any route, position, wound care, and other measures to relive pain and suffering. May use oxygen, suction and manual treatment of airway obstruction as needed for  comfort.      07/25/15 2345    Code Status History    Date Active Date Inactive Code Status Order ID Comments User Context   This patient has a current code status but no historical code status.      TOTAL TIME TAKING CARE OF THIS PATIENT: 33 minutes.    Demetrios Loll M.D on 07/27/2015 at 1:49 PM  Between 7am to 6pm - Pager - [phone]  After 6pm go to [email or web address] - password [email or web address] [facility]  [place] [facility]  Office  [phone]  CC: Primary care physician; No primary care provider on file.   Note: This dictation was prepared with Dragon dictation along with smaller phrase technology. Any transcriptional errors that result from this process are unintentional.

## 2015-07-27 NOTE — Progress Notes (Signed)
OT Cancellation Note  Patient Details Name: Martha Mayer MRN: [id number] DOB: October 18, 1919   Cancelled Treatment:    Reason Eval/Treat Not Completed: Medical issues which prohibited therapy Per verbal discussion with MD Bridgett Larsson, pt now on comfort care. Per OT protocol, will discontinue OT order.  Chrys Racer, OTR/L ascom [phone]  07/27/2015, 12:19 PM

## 2015-07-28 LAB — URINE CULTURE

## 2015-07-31 LAB — CULTURE, BLOOD (ROUTINE X 2)
CULTURE: NO GROWTH
Culture: NO GROWTH

## 2015-08-07 DEATH — deceased

## 2016-12-08 IMAGING — CT CT ANGIO HEAD
3 of 8 series · 16 of 47 positions shown · IV contrast (isovue)
Comparison: CTA without contrast from the same day. MRI brain
07/06/2015.

CLINICAL DATA: Code stroke. Possible CVA. Right-sided weakness and
slurred speech.

EXAM:
CT ANGIOGRAPHY HEAD
TECHNIQUE: Multidetector CT imaging of the head was performed using the
standard protocol during bolus administration of intravenous
contrast. Multiplanar CT image reconstructions and MIPs were
obtained to evaluate the vascular anatomy.
CONTRAST:  60 mL Isovue 370

[Series 5: cta head · axial · 0.45mm/px · z∈[-142,-8]mm · 10 of 162 slices shown]
[im 14/162  brain]
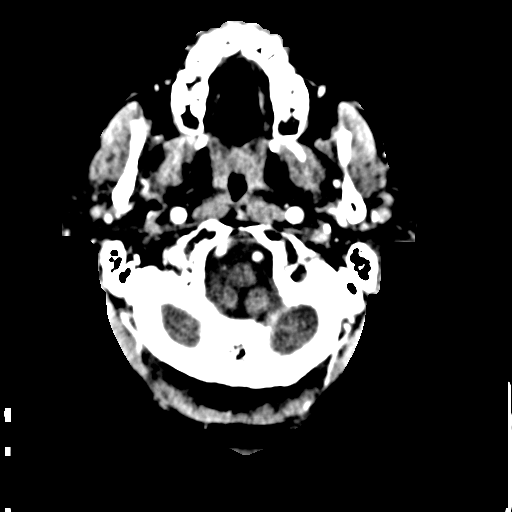
[im 27/162  bone]
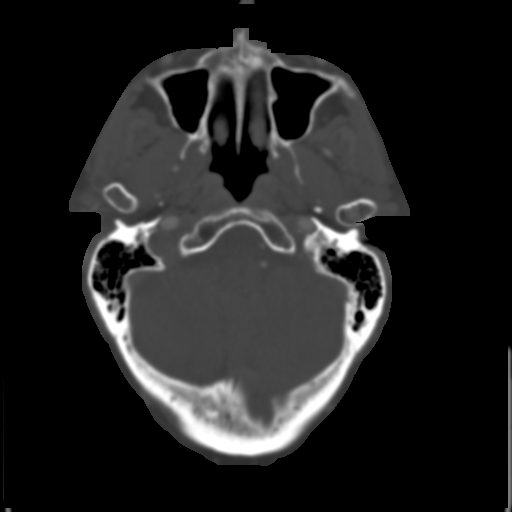
[im 41/162  brain]
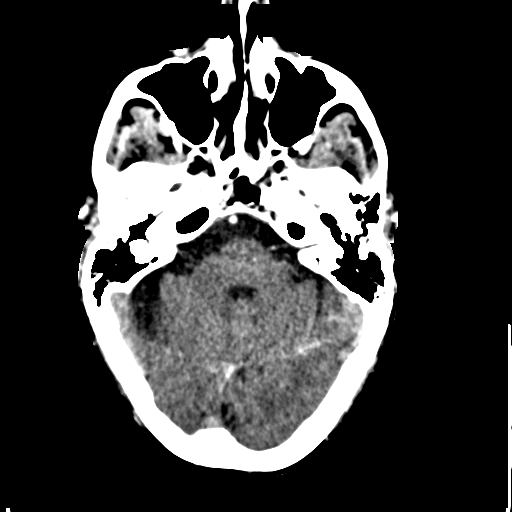
[im 54/162  bone]
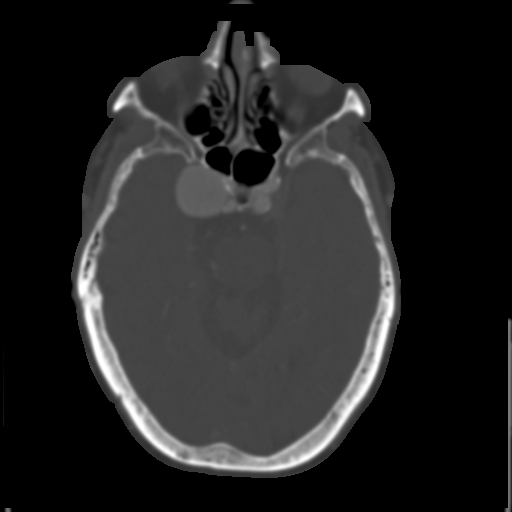
[im 68/162  brain]
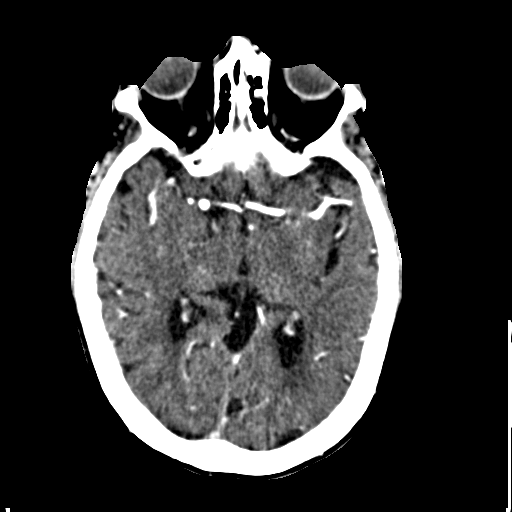
[im 94/162  bone]
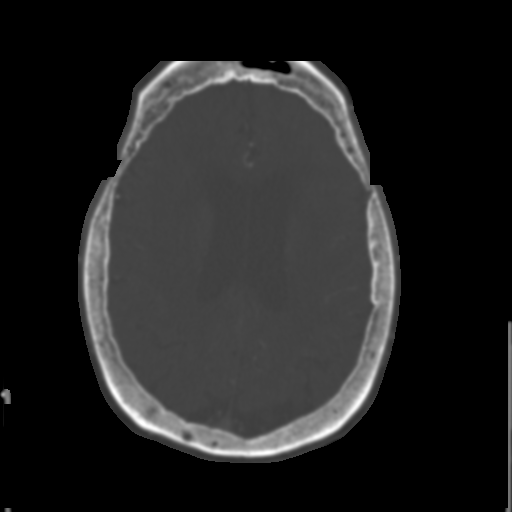
[im 108/162  brain]
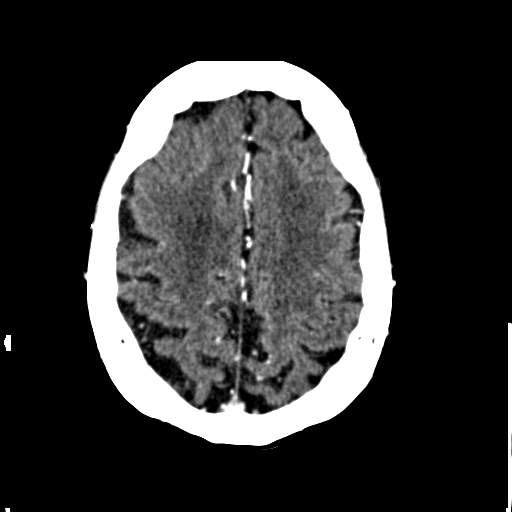
[im 121/162  bone]
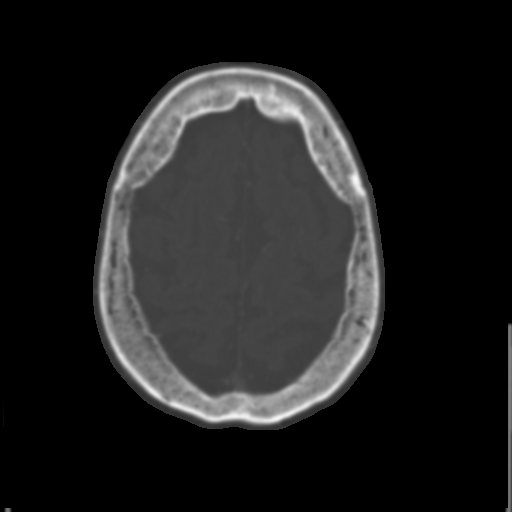
[im 135/162  brain]
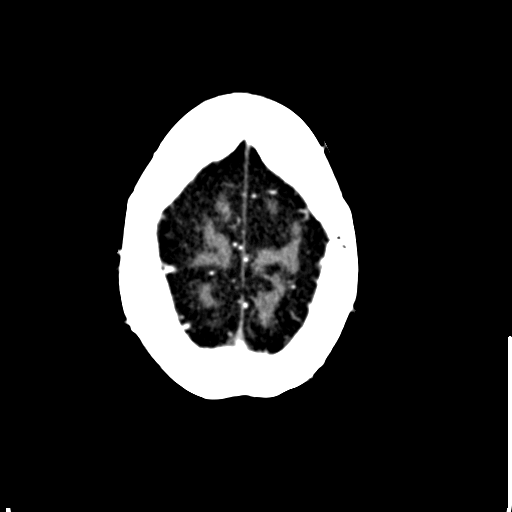
[im 148/162  bone]
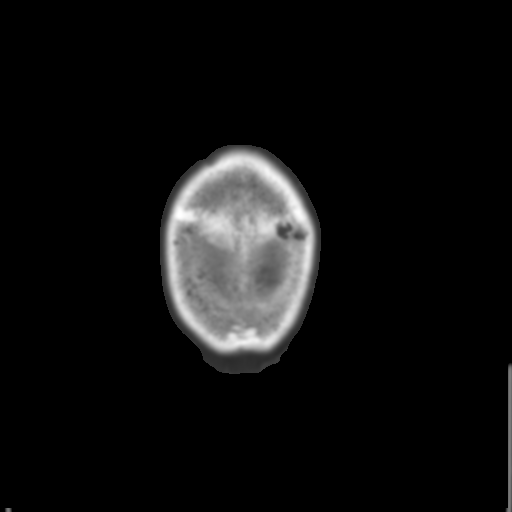

[Series 8: cor thin · coronal · 0.34mm/px · 3 of 207 slices shown]
[im 59/207  brain]
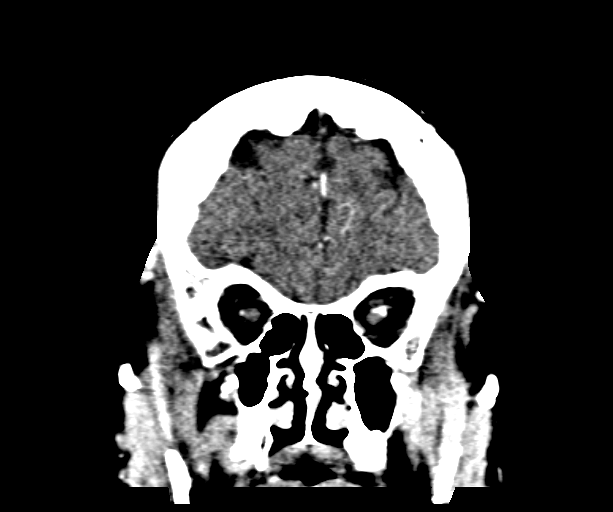
[im 89/207  brain]
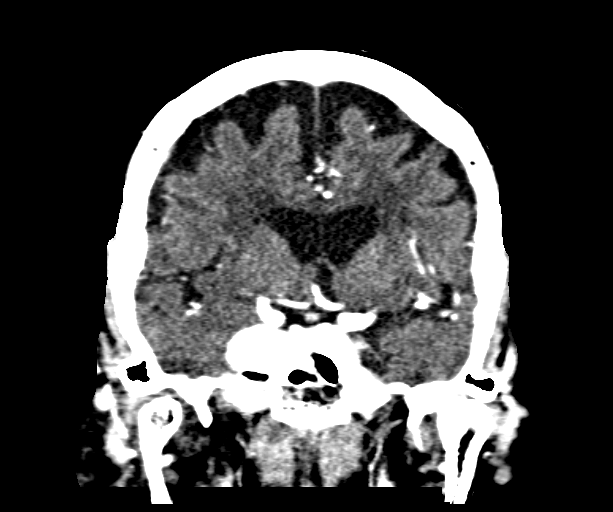
[im 118/207  brain]
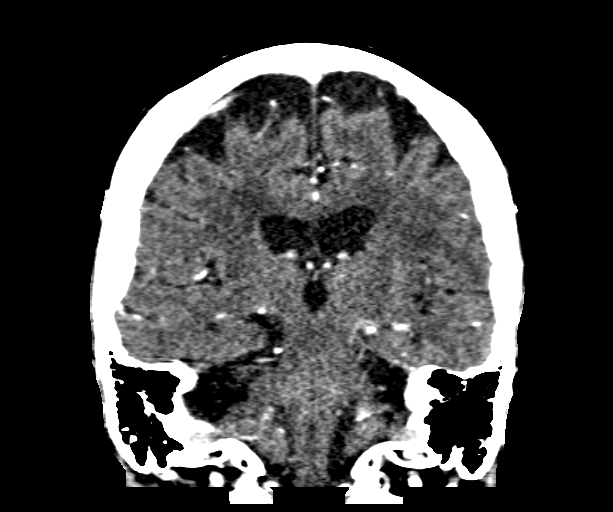

[Series 10: sag thin · sagittal · 0.33mm/px · 3 of 150 slices shown]
[im 30/150  brain]
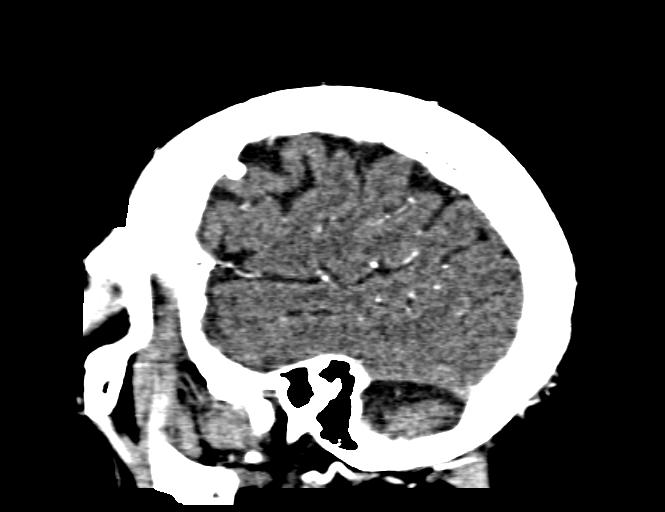
[im 60/150  brain]
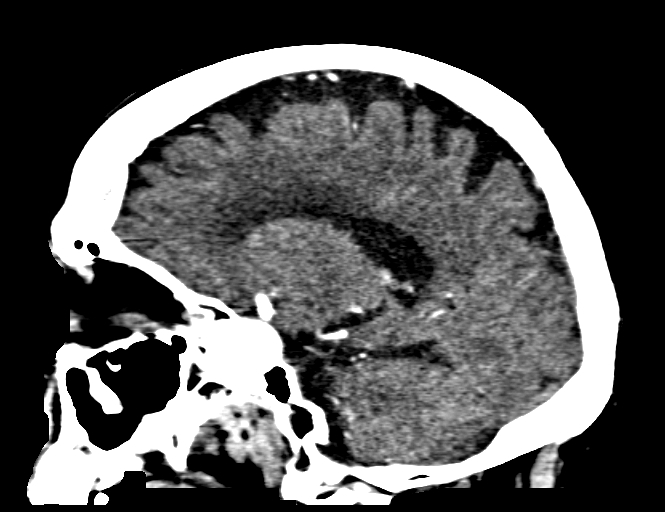
[im 90/150  brain]
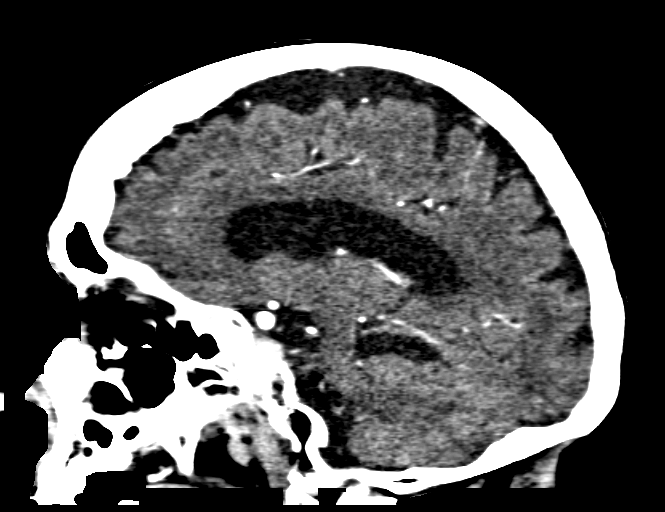

[16 of 47 positions shown; findings below may reference images not displayed]

FINDINGS: CT HEAD

Brain: There is progressive cortical loss within the left temporal
lobe suggesting developing infarct. The basal ganglia are intact.
The insular ribbon is normal. The moderate diffuse periventricular
white matter hypoattenuation is stable.

Calvarium and skull base: The calvarium is intact.

Paranasal sinuses: The sinuses are clear.

Orbits: Bilateral lens replacements are noted. The globes and orbits
are otherwise unremarkable.

CTA HEAD

Anterior circulation: Atherosclerotic calcifications present within
the left cavernous internal carotid artery without a significant
stenosis.

A giant aneurysm extends laterally from the right cavernous sinus
measuring 2.4 x 1.8 x 2.3 cm. This incorporates the native vessel.
Atherosclerotic calcifications are also present within the cavernous
right internal carotid artery. The terminal right ICA is normal.

The A1 and M1 segments are normal. A proximal bifurcation is present
on the right. The anterior communicating artery is patent. The left
anterior cerebral artery is dominant. There is a focal occlusion in
the inferior left MCA territory branch corresponding to a potential
temporal lobe infarction. There is asymmetric attenuation of more
superior left MCA branch is well, representing more distal disease.
Moderate diffuse distal small vessel disease is present in the right
ACA branches as well.

Posterior circulation: The left vertebral artery is slightly
dominant to the right. Dominant AICA vessels are present
bilaterally. There is mild narrowing of the right vertebral artery.
The vertebrobasilar junction is within normal limits. The basilar
artery is small, essentially terminating at the superior cerebellar
arteries. Bilateral posterior communicating arteries are present.
There is moderate attenuation of distal PCA branch vessels, right
greater than left.

Venous sinuses: The dural sinuses are patent. The straight sinus and
deep cerebral veins are intact. The cortical veins are unremarkable.

Anatomic variants: Bilateral fetal type posterior cerebral arteries.

Delayed phase:Delayed images best demonstrate the probable left
temporal lobe infarct. No pathologic enhancement is present.
IMPRESSION: 1. High-grade stenosis or occlusion of the posterior inferior left
MCA branch vessel.
2. Probable developing left temporal lobe nonhemorrhagic infarct.
3. Giant aneurysm of the cavernous right internal carotid artery
with lateral extension. The aneurysm measures 2.4 x 1.8 x 2.3 cm.
4. Diffuse distal small vessel disease, more pronounced in the left
MCA territory than right.

## 2017-07-28 IMAGING — US US CAROTID DUPLEX BILAT
1 series · 13 of 24 positions shown · non-contrast
Comparison: Head CT - 07/25/2015; brain MRI -07/06/2015

CLINICAL DATA: Left MCA CVA. Syncopal episode. History of
hyperlipidemia.

EXAM:
BILATERAL CAROTID DUPLEX ULTRASOUND
TECHNIQUE: Gray scale imaging, color Doppler and duplex ultrasound were
performed of bilateral carotid and vertebral arteries in the neck.

[Series 1: us carotid duplex bilat · 0.06mm/px · 13 of 74 slices shown]
[im 1/74]
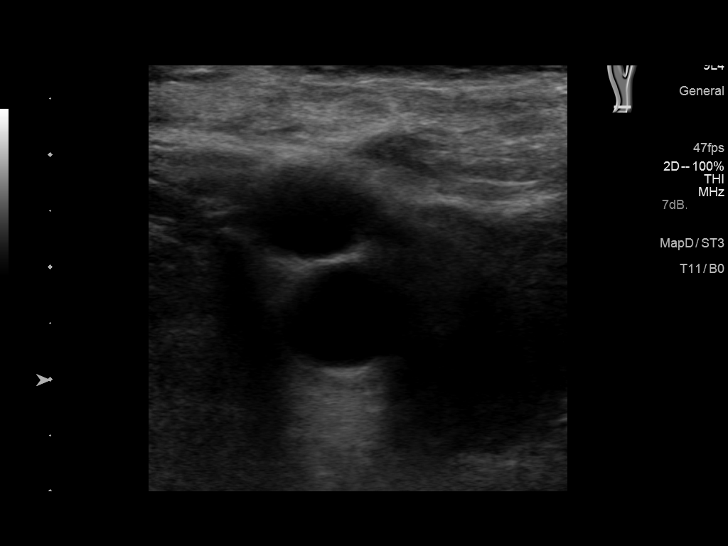
[im 7/74]
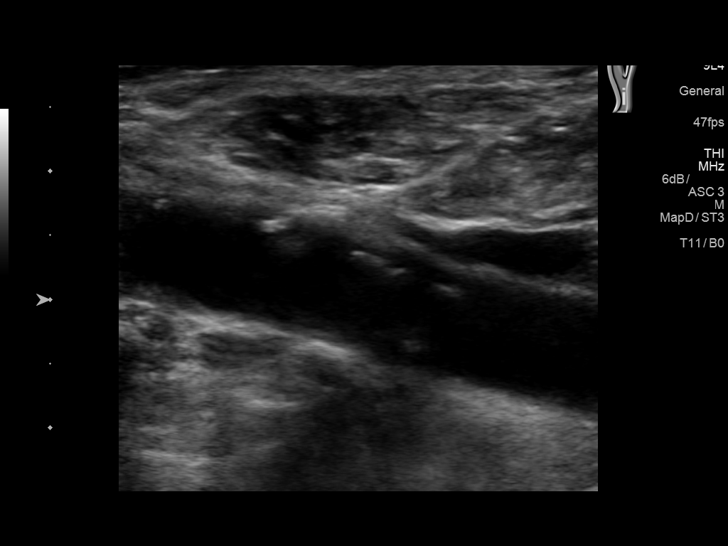
[im 13/74]
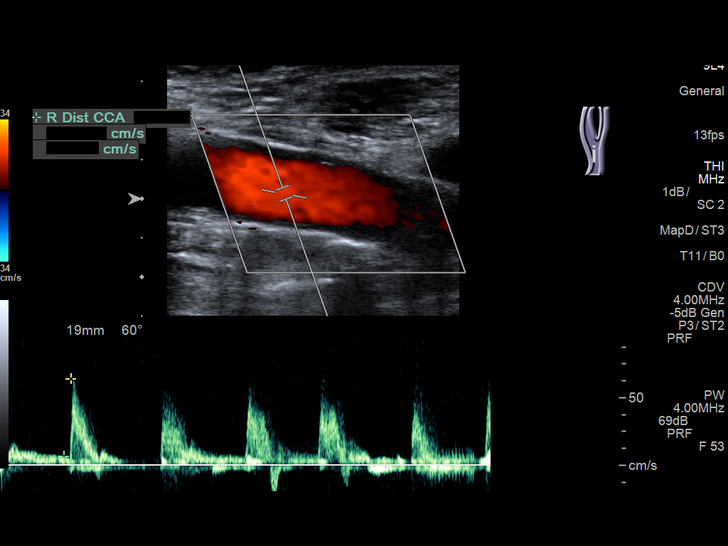
[im 20/74]
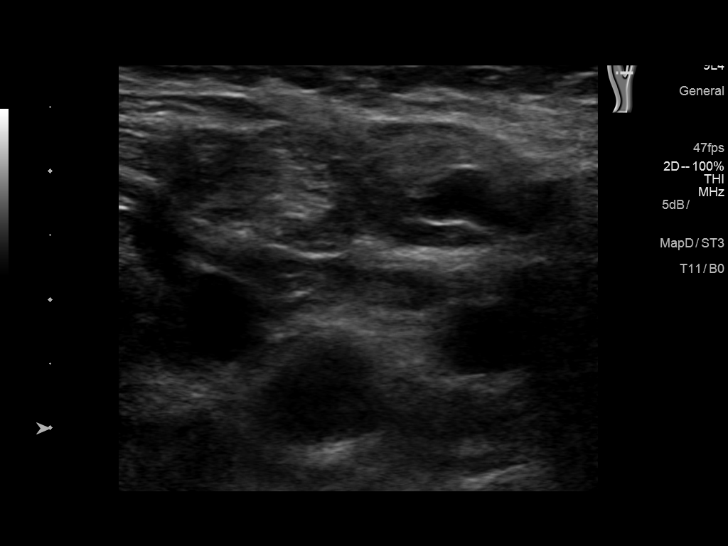
[im 26/74]
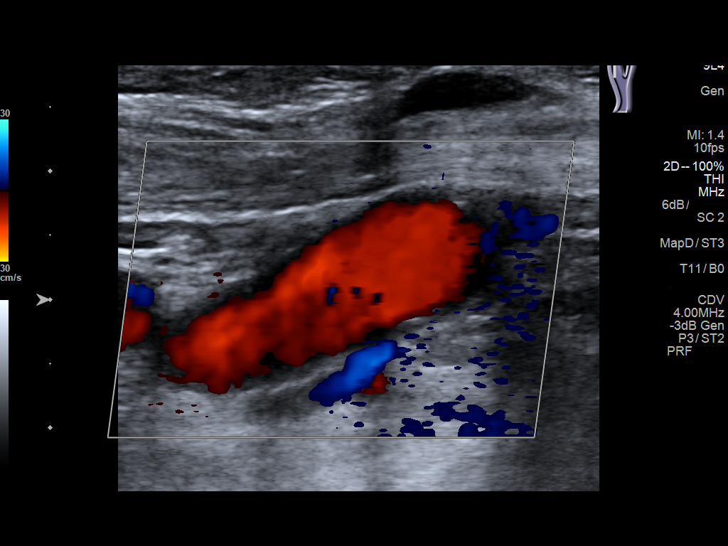
[im 32/74]
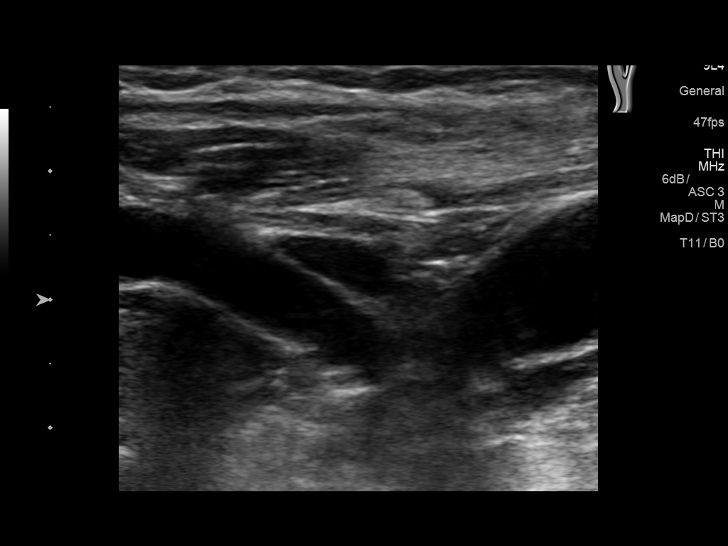
[im 39/74]
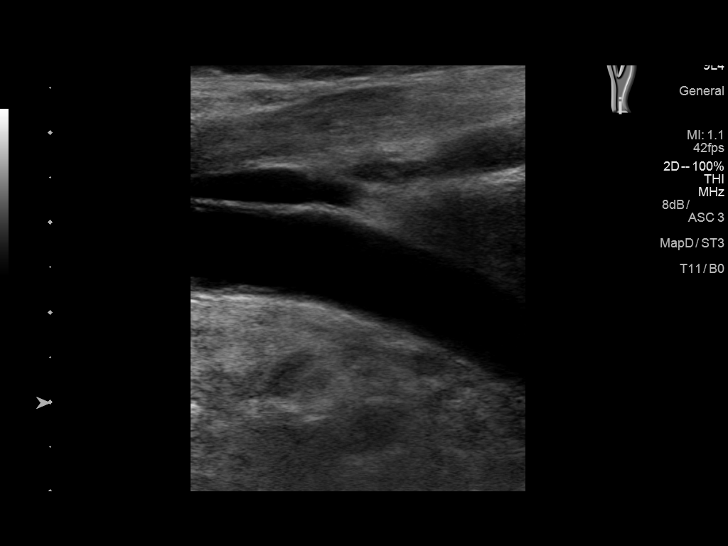
[im 42/74]
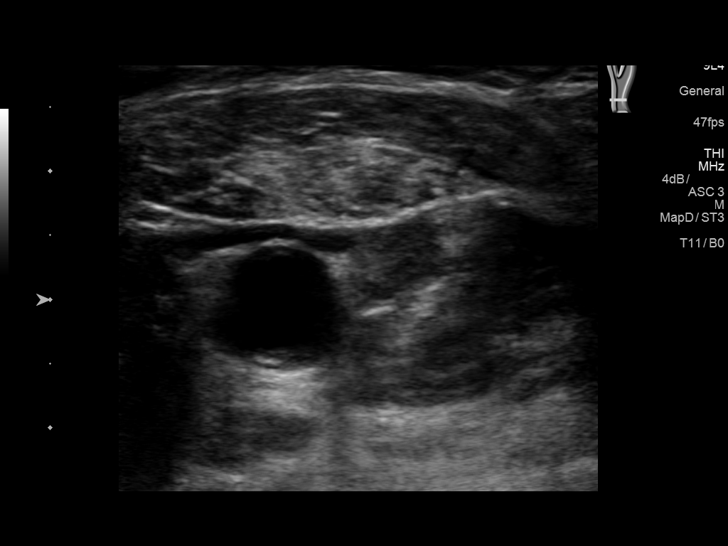
[im 48/74]
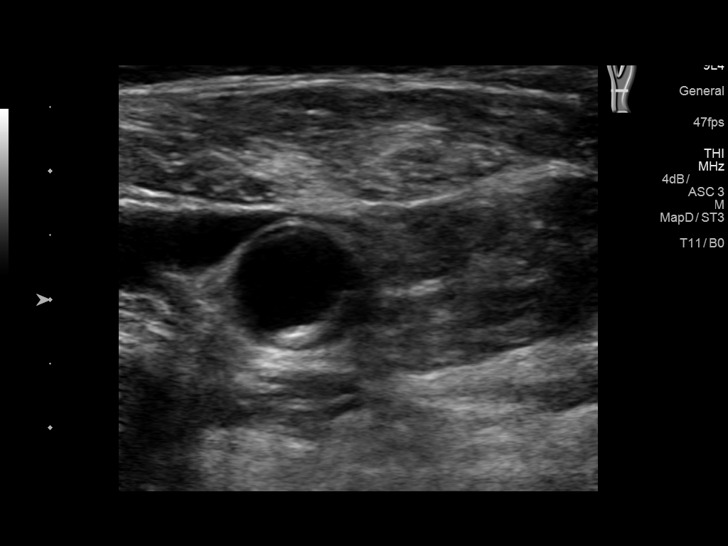
[im 54/74]
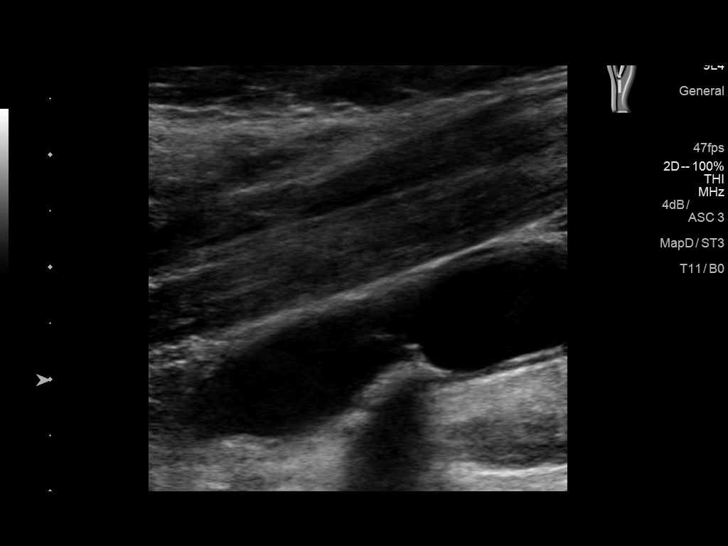
[im 61/74]
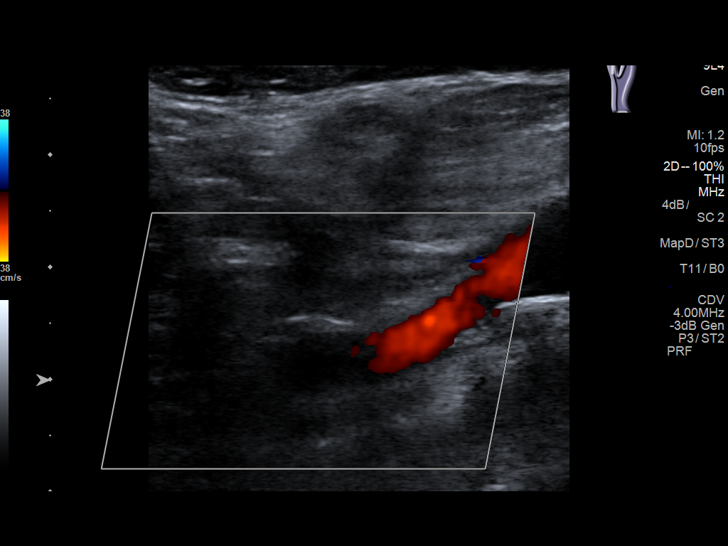
[im 67/74]
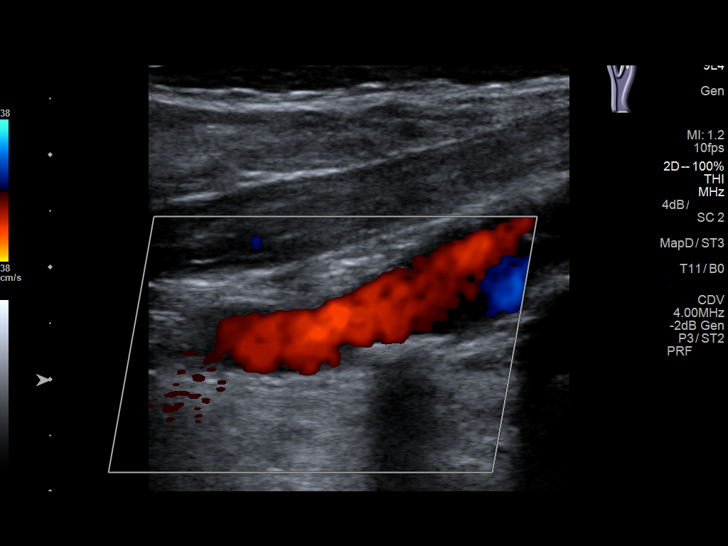
[im 74/74]
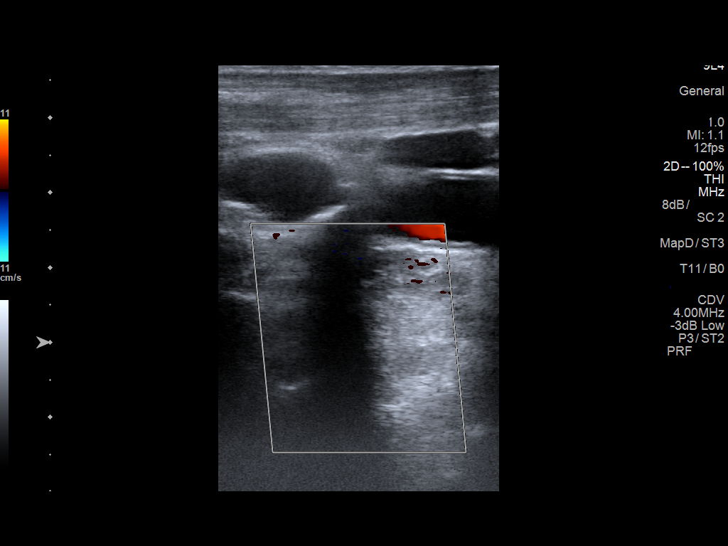

[13 of 24 positions shown; findings below may reference images not displayed]

FINDINGS: Criteria: Quantification of carotid stenosis is based on velocity
parameters that correlate the residual internal carotid diameter
with NASCET-based stenosis levels, using the diameter of the distal
internal carotid lumen as the denominator for stenosis measurement.

The following velocity measurements were obtained:

RIGHT

ICA:  87/14 cm/sec

CCA:  64/9 cm/sec

SYSTOLIC ICA/CCA RATIO:

DIASTOLIC ICA/CCA RATIO:

ECA:  92 cm/sec

LEFT

ICA:  76/7 cm/sec

CCA:  63/6 cm/sec

SYSTOLIC ICA/CCA RATIO:

DIASTOLIC ICA/CCA RATIO:

ECA:  91 cm/sec

RIGHT CAROTID ARTERY: There is a minimal amount of eccentric mixed
echogenic plaque within the mid and distal aspects of the right
common carotid artery (images 8 and 12). There is a very minimal
amount of eccentric mixed echogenic plaque involving the origin and
proximal aspect the right internal carotid artery (image 26), not
resulting in elevated peak systolic velocities within the
interrogated course the right internal carotid artery to suggest a
hemodynamically significant stenosis.

RIGHT VERTEBRAL ARTERY:  Antegrade Flow

LEFT CAROTID ARTERY: There is a moderate amount of eccentric mixed
echogenic partially shadowing plaque within the left carotid bulb
(images 53 and 55), extending to involve the origin and proximal
aspects of the left internal carotid artery (image 64, not resulting
in elevated peak systolic velocities within the interrogated course
of the left internal carotid artery to suggest a hemodynamically
significant stenosis.

LEFT VERTEBRAL ARTERY:  Not visualized
IMPRESSION: 1. Minimal to moderate amount of bilateral atherosclerotic plaque,
left greater than right, not resulting in hemodynamically
significant stenosis within either internal carotid artery.
2. Non visualization of a patent left vertebral artery - in the
absence of prior examinations, this is of uncertain clinical
significance as this patient may have congenital absence of the
vertebral artery versus age-indeterminate occlusion.
